# Patient Record
Sex: Female | Born: 1960 | Race: Asian | Hispanic: No | Marital: Married | Smoking: Never smoker
Health system: Southern US, Community
[De-identification: ages and names within clinical notes are randomized; demographics above are authoritative.]

---

## 2016-06-13 ENCOUNTER — Other Ambulatory Visit: Payer: Self-pay | Admitting: Internal Medicine

## 2016-06-13 DIAGNOSIS — Z1231 Encounter for screening mammogram for malignant neoplasm of breast: Secondary | ICD-10-CM

## 2016-07-01 ENCOUNTER — Ambulatory Visit
Admission: RE | Admit: 2016-07-01 | Discharge: 2016-07-01 | Disposition: A | Discharge status: Home / Self Care | Payer: BLUE CROSS/BLUE SHIELD | Source: Ambulatory Visit | Attending: Internal Medicine | Admitting: Internal Medicine

## 2016-07-01 ENCOUNTER — Encounter: Payer: Self-pay | Admitting: Radiology

## 2016-07-01 DIAGNOSIS — Z1231 Encounter for screening mammogram for malignant neoplasm of breast: Secondary | ICD-10-CM

## 2017-05-15 ENCOUNTER — Ambulatory Visit: Payer: BLUE CROSS/BLUE SHIELD | Attending: Orthopedic Surgery | Admitting: Occupational Therapy

## 2017-05-15 ENCOUNTER — Other Ambulatory Visit: Payer: Self-pay

## 2017-05-15 DIAGNOSIS — M6281 Muscle weakness (generalized): Secondary | ICD-10-CM | POA: Insufficient documentation

## 2017-05-15 DIAGNOSIS — M25542 Pain in joints of left hand: Secondary | ICD-10-CM | POA: Insufficient documentation

## 2017-05-15 DIAGNOSIS — R208 Other disturbances of skin sensation: Secondary | ICD-10-CM | POA: Diagnosis present

## 2017-05-15 DIAGNOSIS — M25541 Pain in joints of right hand: Secondary | ICD-10-CM | POA: Diagnosis present

## 2017-05-15 NOTE — Therapy (Signed)
[facility] PH[facility] AND SP[facility] MEDICINE [address] [place], [place], [address] Phone: [phone]   Fax:  [phone]  Occupational Therapy Evaluation  Patient Details  Name: Rhonda James MRN: [id number] Date of Birth: 01/03/1961 Referring Provider: Rosita KeaMenz   Encounter Date: 05/15/2017  OT End of Session - 05/15/17 1257    Visit Number  1    Number of Visits  8    Date for OT Re-Evaluation  06/12/17    OT Start Time  0918    OT Stop Time  1011    OT Time Calculation (min)  53 min    Activity Tolerance  Patient tolerated treatment well    Behavior During Therapy  [facility] for tasks assessed/performed      Past Medical History: Past Medical History:  Diagnosis Date  . GERD (gastroesophageal reflux disease)  . Osteoporosis   Past Surgical History: Past Surgical History:  Procedure Laterality Date  . No Past Surgery    Medications: Current Outpatient Medications Ordered in Epic  Medication Sig Dispense Refill  . acetaminophen (TYLENOL) 500 MG tablet Take 500 mg by mouth every 8 (eight) hours as needed for Pain.  Marland Kitchen. alendronate (FOSAMAX) 70 MG tablet Take 1 tablet (70 mg total) by mouth every 7 (seven) days Take with a full glass of water. Do not lie down for the next 30 min. 12 tablet 3  . calcium citrate-vitamin D3 (CITRACAL+D) 315-200 mg-unit tablet Take 2 tablets by mouth 2 (two) times daily with meals 120 tablet 11  . pantoprazole (PROTONIX) 40 MG DR tablet Take 1 tablet (40 mg total) by mouth once daily 90 tablet 3      Allergies: No Known Allergies       .  There were no vitals filed for this visit.  Subjective Assessment - 05/15/17 1246    Subjective   I seen Dr 2 wks ago and he gave me shot - my pain in my R thumb better but L thumb still the same, numbness in L middle finger, R index and middle fingers - had it about 6 months at least and the thumb pain started about 6 wks ago - i work at [employer]  6 days a week - 7-10 hrs day      Patient Stated Goals  I want to get the pain and numbness better so I can do my work  and use them better     Currently in Pain?  Yes    Pain Score  6     Pain Location  -- Thumb    Pain Orientation  Left R thumb 2/10    Pain Descriptors / Indicators  Aching;Numbness;Sharp;Shooting    Pain Type  Acute pain    Pain Onset  More than a month ago    Pain Frequency  Constant        [facility] OT Assessment - 05/15/17 0001      Assessment   Medical Diagnosis  Bilateral CTS and DeQuervain    Referring Provider  Rosita KeaMenz    Onset Date/Surgical Date  10/28/16    Hand Dominance  Right    Next MD Visit  -- in 4 wks after OT       Home  Environment   Lives With  Spouse      Prior Function   Vocation  Full time employment    Leisure  Nail salon, house work       Palpation   Palpation comment  Positive Finkelstein L worse than R , Tender over distal radius head       AROM   Left Wrist Flexion  -- increase pain     Left Wrist Radial Deviation  -- increase pain       Strength   Right Hand Grip (lbs)  35    Right Hand Lateral Pinch  13 lbs    Right Hand 3 Point Pinch  12 lbs    Left Hand Grip (lbs)  26    Left Hand Lateral Pinch  11 lbs    Left Hand 3 Point Pinch  10 lbs      Left Hand AROM   L Thumb Radial ADduction/ABduction 0-55  -- increase pain     L Thumb Palmar ADduction/ABduction 0-45  -- increase pain             COntrast on bilateral hands and wrist   3 x day  Wrist prefab splint on R wrist - most all the time   and  Fabricated Custom thumb spica over radial side of wrist - and wrist neoprene wrap - wear most all the time   Change the way use her hand - massaging and doing nail  Avoid tight grip  And use larger joints - palms to massage  And forearm to carry and palms             OT Education - 05/15/17 1256    Education provided  Yes    Education Details  anatomy , findings and HEP     Person(s) Educated  Patient interpreter vietnamese    interpreter  vietnamese    Methods  Explanation;Demonstration;Tactile cues;Handout    Comprehension  Verbalized understanding;Returned demonstration       OT Short Term Goals - 05/15/17 1305      OT SHORT TERM GOAL #1   Title  Pt pain on PRWHE improve with more than 20 points     Baseline  pain at eval PRWHE 33/50     Time  3    Period  Weeks    Status  New    Target Date  06/05/17        OT Long Term Goals - 05/15/17 1307      OT LONG TERM GOAL #1   Title  Pt to be independent in HEP for modifications of work act , splint wearing and ROM / modalities     Baseline  no knowledge    Time  4    Period  Weeks    Status  New    Target Date  06/12/17      OT LONG TERM GOAL #2   Title  Function on PRWHE for hands improve with more than 15 points     Baseline  at eval 26/50 on PRWHE for function     Time  4    Period  Weeks    Status  New    Target Date  06/12/17            Plan - 05/15/17 1258    Clinical Impression Statement  Pt present at OT evaluation with diagnosis of bilateral CTS and DeQuervains - unclear to get detail thur interpreter about when symptoms started - she says numbness is day and night time and at least 6 months and then tendonitis at the thumbs about 6 wks - she had shorts at MD 2 wks ago - R hand feels better but  L still the  same - pt 2/10 on R and 6/10 on L  - AROM is WNL but pain - and positve test for DeQuervain in L worse than R - pt work  in [employer] and report she cannot wear splint  there - and  can sleep with it - reinforce for pt to wear splints as much as she can to decrease symptoms - and immoblilize L thumb - pt also ed on 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 - pt can benefit from OT services     Occupational performance deficits (Please refer to evaluation for details):  ADL's;IADL's;Work;Play;Leisure;Social Participation    Rehab Potential  Fair    Current Impairments/barriers affecting progress:  Pt working at  [employer] and unable to modify her activities and wear splint     OT Frequency  2x / week    OT Duration  4 weeks    OT Treatment/Interventions  Self-care/ADL training;Therapeutic exercise;Contrast Bath;Ultrasound;Manual Therapy;Patient/family education;Splinting    Plan  Assess if pt could wear her splints and modify work     Clinical Decision Making  Several treatment options, min-mod task modification necessary    OT Home Exercise Plan  see pt instruction    Consulted and Agree with Plan of Care  Patient       Patient will benefit from skilled therapeutic intervention in order to improve the following deficits and impairments:  Pain, Impaired flexibility, Impaired sensation, Impaired UE functional use, Decreased strength  Visit Diagnosis: Pain in joint of left hand - Plan: Ot plan of care cert/re-cert  Pain in joint of right hand - Plan: Ot plan of care cert/re-cert  Other disturbances of skin sensation - Plan: Ot plan of care cert/re-cert  Muscle weakness (generalized) - Plan: Ot plan of care cert/re-cert    Problem List There are no active problems to display for this patient.   Oletta Cohn OTR/L,CLT 05/15/2017, 1:12 PM  [facility] PHYSICAL AND SPORTS MEDICINE [address], [place], [address] Phone: [phone]   Fax:  [phone]  Name: Rhonda James MRN: [id number] Date of Birth: 1961/01/08

## 2017-05-15 NOTE — Patient Instructions (Signed)
COntrast on bilateral hands and wrist   3 x day  Wrist prefab splint on R wrist - most all the time   and Custom thumb spica over radial side of wrist - and wrist neoprene wrap - wear most all the time   Change the way use her hand - massaging and doing nail  Avoid tight grip  And use larger joints - palms to massage  And forearm to carry and palms

## 2017-05-20 ENCOUNTER — Ambulatory Visit: Payer: BLUE CROSS/BLUE SHIELD | Admitting: Occupational Therapy

## 2017-05-20 DIAGNOSIS — M25541 Pain in joints of right hand: Secondary | ICD-10-CM

## 2017-05-20 DIAGNOSIS — M25542 Pain in joints of left hand: Secondary | ICD-10-CM

## 2017-05-20 DIAGNOSIS — R208 Other disturbances of skin sensation: Secondary | ICD-10-CM

## 2017-05-20 DIAGNOSIS — M6281 Muscle weakness (generalized): Secondary | ICD-10-CM

## 2017-05-20 NOTE — Therapy (Signed)
[facility] SPORTS MEDICINE [address] [place], [address] Phone: [phone]   Fax:  [phone]  Occupational Therapy Treatment  Patient Details  Name: Rhonda James MRN: [id number] Date of Birth: 1960/12/21 Referring Provider: Rosita Kea   Encounter Date: 05/20/2017  OT End of Session - 05/20/17 1056    Visit Number  2    Number of Visits  8    Date for OT Re-Evaluation  06/12/17    OT Start Time  0823    OT Stop Time  0919    OT Time Calculation (min)  56 min    Activity Tolerance  Patient tolerated treatment well    Behavior During Therapy  [facility] for tasks assessed/performed       No past medical history on file.  No past surgical history on file.  There were no vitals filed for this visit.  Subjective Assessment - 05/20/17 0909    Subjective   Little better- pain about 2/10 on R and L 5/10- splints I did wear at night time - but not when I got home - numbness still on and off during day and night time on R - middle 3 fingers     Patient Stated Goals  I want to get the pain and numbness better so I can do my work  and use them better     Pain Score  5     Pain Location  -- L 1 st dorsal compartment     Pain Orientation  Left    Pain Type  Acute pain    Pain Onset  More than a month ago    Pain Frequency  Constant        Tender over distal radius head and positive Finkelstein on L  5/10 and R 2/10 Numbness still in middle 3 digits comes and goes on the R  Splints she only wore at night time  Did try and massage at work little different and not using her thumbs as much              OT Treatments/Exercises (OP) - 05/20/17 0001      Iontophoresis   Type of Iontophoresis  Dexamethasone    Location  L 1 st dorsal compartment     Dose  small patch - 2.0 current     Time  19      RUE Contrast Bath   Time  11 minutes    Comments  decrease pain and numbness and prrio to soft tissue mobs       LUE  Contrast Bath   Time  11 minutes    Comments  decrease pain prior to soft tissue mobs      after contrast did some soft tissue to bilateral webspaces  And then Graston tool nr 2 for brushing , sweeping over dorsal and volar forearms and wrist   pt tight in volar wrist and forearm   pt do report years ago she had some issues with numbness in middle finger tip on R and trigger thumb on there L when she use to live in [place]   SKin check done on L wrist and pt ed on ionto   and what to expect - and then skin check done after wards - no issues on skin         OT Education - 05/20/17 1056    Education provided  Yes    Education Details  ionto use  and Homeprogram/ splint use     Person(s) Educated  Patient interpreter   interpreter   Methods  Explanation;Demonstration;Tactile cues    Comprehension  Verbalized understanding;Returned demonstration       OT Short Term Goals - 05/15/17 1305      OT SHORT TERM GOAL #1   Title  Pt pain on PRWHE improve with more than 20 points     Baseline  pain at eval PRWHE 33/50     Time  3    Period  Weeks    Status  New    Target Date  06/05/17        OT Long Term Goals - 05/15/17 1307      OT LONG TERM GOAL #1   Title  Pt to be independent in HEP for modifications of work act , splint wearing and ROM / modalities     Baseline  no knowledge    Time  4    Period  Weeks    Status  New    Target Date  06/12/17      OT LONG TERM GOAL #2   Title  Function on PRWHE for hands improve with more than 15 points     Baseline  at eval 26/50 on PRWHE for function     Time  4    Period  Weeks    Status  New    Target Date  06/12/17            Plan - 05/20/17 1205    Clinical Impression Statement  Pt report she tried to change little 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 but cannot wear splints at work - feel like pain is little better -and numbness in R hand comes and goes - did wear splints at night time - husband can only bring pt on  off days - pt will not have her next ionto until week from now - but reinforce with pt to modify how she massage and do the nails and wear splints as much as she can     Occupational performance deficits (Please refer to evaluation for details):  ADL's;IADL's;Work;Play;Leisure;Social Participation    Rehab Potential  Fair    Current Impairments/barriers affecting progress:  Pt working at [employer] and unable to modify her activities and wear splint     OT Frequency  2x / week    OT Duration  4 weeks    OT Treatment/Interventions  Self-care/ADL training;Therapeutic exercise;Contrast Bath;Ultrasound;Manual Therapy;Patient/family education;Splinting    Plan  Ionto - assess if pain and numbness improve     Clinical Decision Making  Several treatment options, min-mod task modification necessary    OT Home Exercise Plan  see pt instruction    Consulted and Agree with Plan of Care  Patient       Patient will benefit from skilled therapeutic intervention in order to improve the following deficits and impairments:  Pain, Impaired flexibility, Impaired sensation, Impaired UE functional use, Decreased strength  Visit Diagnosis: Pain in joint of left hand  Pain in joint of right hand  Muscle weakness (generalized)  Other disturbances of skin sensation    Problem List There are no active problems to display for this patient.   Oletta CohnuPreez, Shalom Mcguiness OTR/L,CLT 05/20/2017, 12:09 PM  [facility] [address] , [place], [address] Phone: [phone]   Fax:  [phone]  Name: Rhonda James MRN: [id number] Date of Birth: 06/20/1960

## 2017-05-20 NOTE — Patient Instructions (Signed)
Same HEP  

## 2017-05-28 ENCOUNTER — Ambulatory Visit: Payer: BLUE CROSS/BLUE SHIELD | Attending: Orthopedic Surgery | Admitting: Occupational Therapy

## 2017-05-28 DIAGNOSIS — M6281 Muscle weakness (generalized): Secondary | ICD-10-CM | POA: Diagnosis present

## 2017-05-28 DIAGNOSIS — M25542 Pain in joints of left hand: Secondary | ICD-10-CM | POA: Diagnosis not present

## 2017-05-28 DIAGNOSIS — R208 Other disturbances of skin sensation: Secondary | ICD-10-CM | POA: Insufficient documentation

## 2017-05-28 DIAGNOSIS — M25541 Pain in joints of right hand: Secondary | ICD-10-CM | POA: Insufficient documentation

## 2017-05-28 NOTE — Therapy (Signed)
[facility]ORTS MEDICINE [address] [place], [address] Phone: [phone]   Fax:  [phone]  Occupational Therapy Treatment  Patient Details  Name: Rhonda James MRN: [id number] Date of Birth: 1960/07/09 Referring Provider: Rosita Kea   Encounter Date: 05/28/2017  OT End of Session - 05/28/17 0917    Visit Number  3    Number of Visits  8    Date for OT Re-Evaluation  06/12/17    OT Start Time  0900    OT Stop Time  0957    OT Time Calculation (min)  57 min    Activity Tolerance  Patient tolerated treatment well    Behavior During Therapy  [facility] for tasks assessed/performed       No past medical history on file.  No past surgical history on file.  There were no vitals filed for this visit.  Subjective Assessment - 05/28/17 0912    Subjective   Little better again - still wearing splints only at night time - but not at work able to - numbness still in R middle 3 fingers come and goes -and some at night time - pain 5/10 in L wrist /base of thumb and 2/10 but only with pressure base of thumb     Patient Stated Goals  I want to get the pain and numbness better so I can do my work  and use them better     Currently in Pain?  Yes    Pain Score  5     Pain Location  -- 1st dorsal compartment     Pain Orientation  Left    Pain Descriptors / Indicators  Aching;Numbness;Tender    Pain Type  Acute pain          Tender over distal radius head and positive Finkelstein on L  5/10 and R 2/10 Numbness still in middle 3 digits comes and goes on the R  Splints she only wore at night time  Did try and massage at work little different and not using her thumbs as much Review again with pt to grasp tools not tight            OT Treatments/Exercises (OP) - 05/28/17 0001      Iontophoresis   Type of Iontophoresis  Dexamethasone    Location  L 1 st dorsal compartment       RUE Contrast Bath   Time  11 minutes    Comments   decrease pain       LUE Contrast Bath   Time  11 minutes    Comments  decrease pain        after contrast did some soft tissue to bilateral webspaces  And then Graston tool nr 2 for brushing , sweeping over dorsal and volar forearms and wrist   pt tight in volar wrist and forearm    SKin check done on L wrist prior ionto - and pt ed on ionto - did med patch this date - at 1.3 and 1.7 current   and what to expect - and then skin check done after wards - had one pin size little blister - pt to use neosporin if needed        OT Education - 05/28/17 0917    Education provided  Yes    Education Details  splint wearing and ionto use -     Person(s) Educated  Patient interpreter   interpreter   Methods  Explanation;Demonstration;Tactile cues    Comprehension  Verbalized understanding;Returned demonstration       OT Short Term Goals - 05/15/17 1305      OT SHORT TERM GOAL #1   Title  Pt pain on PRWHE improve with more than 20 points     Baseline  pain at eval PRWHE 33/50     Time  3    Period  Weeks    Status  New    Target Date  06/05/17        OT Long Term Goals - 05/15/17 1307      OT LONG TERM GOAL #1   Title  Pt to be independent in HEP for modifications of work act , splint wearing and ROM / modalities     Baseline  no knowledge    Time  4    Period  Weeks    Status  New    Target Date  06/12/17      OT LONG TERM GOAL #2   Title  Function on PRWHE for hands improve with more than 15 points     Baseline  at eval 26/50 on PRWHE for function     Time  4    Period  Weeks    Status  New    Target Date  06/12/17            Plan - 05/28/17 0936    Clinical Impression Statement  Pt report pain little better and that hse is 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 - doing okay with R but still numbness - still can only wear splints at night time only      Occupational performance deficits (Please refer to evaluation for details):   ADL's;IADL's;Work;Play;Leisure;Social Participation    Rehab Potential  Fair    Current Impairments/barriers affecting progress:  Pt working at [employer] and unable to modify her activities and wear splint     OT Frequency  2x / week    OT Duration  4 weeks    OT Treatment/Interventions  Self-care/ADL training;Therapeutic exercise;Contrast Bath;Ultrasound;Manual Therapy;Patient/family education;Splinting    Plan  Ionto 3rd session  - assess if pain and numbness improve     Clinical Decision Making  Several treatment options, min-mod task modification necessary    OT Home Exercise Plan  see pt instruction    Consulted and Agree with Plan of Care  Patient       Patient will benefit from skilled therapeutic intervention in order to improve the following deficits and impairments:  Pain, Impaired flexibility, Impaired sensation, Impaired UE functional use, Decreased strength  Visit Diagnosis: Pain in joint of left hand  Pain in joint of right hand  Muscle weakness (generalized)  Other disturbances of skin sensation    Problem List There are no active problems to display for this patient.   Oletta Cohn OTR/L,CLT 05/28/2017, 9:55 AM  [facility] PHYSICAL AND SPORTS MEDICINE [address], [place], [address] Phone: [phone]   Fax:  [phone]  Name: Rhonda James MRN: [id number] Date of Birth: 06/22/1960

## 2017-05-29 ENCOUNTER — Ambulatory Visit: Payer: BLUE CROSS/BLUE SHIELD | Admitting: Occupational Therapy

## 2017-05-29 DIAGNOSIS — M6281 Muscle weakness (generalized): Secondary | ICD-10-CM

## 2017-05-29 DIAGNOSIS — R208 Other disturbances of skin sensation: Secondary | ICD-10-CM

## 2017-05-29 DIAGNOSIS — M25541 Pain in joints of right hand: Secondary | ICD-10-CM

## 2017-05-29 DIAGNOSIS — M25542 Pain in joints of left hand: Secondary | ICD-10-CM

## 2017-05-29 NOTE — Patient Instructions (Signed)
Same

## 2017-05-29 NOTE — Therapy (Signed)
[facility] PHYSICAL AND SPORTS MEDICINE [address] [place], [address] Phone: [phone]   Fax:  [phone]  Occupational Therapy Treatment  Patient Details  Name: Rhonda James MRN: [id number] Date of Birth: Mar 12, 1960 Referring Provider: Rosita Kea   Encounter Date: 05/29/2017  OT End of Session - 05/29/17 2025    Visit Number  4    Number of Visits  8    Date for OT Re-Evaluation  06/12/17    OT Start Time  1440    OT Stop Time  1520    OT Time Calculation (min)  40 min    Activity Tolerance  Patient tolerated treatment well    Behavior During Therapy  [facility] for tasks assessed/performed       No past medical history on file.  No past surgical history on file.  There were no vitals filed for this visit.  Subjective Assessment - 05/29/17 1510    Subjective   My hands little better- my R wrist feels good ,  my L hand     Patient Stated Goals  I want to get the pain and numbness better so I can do my work  and use them better     Currently in Pain?  Yes    Pain Score  4     Pain Location  Wrist    Pain Orientation  Left    Pain Descriptors / Indicators  Aching       No pain with AROM for R thumb and wrist - tenderness over distal radius - 2/10  L wrist tenderness 4/10   pain with RD and thumb RA - resistance and AROM   but improving  Tender over proximal extensor mass at forearm L             OT Treatments/Exercises (OP) - 05/29/17 0001      Iontophoresis   Type of Iontophoresis  Dexamethasone    Location  L 1 st dorsal compartment     Dose  small patch - 2.0 current     Time  19      RUE Contrast Bath   Time  11 minutes    Comments  decrease pain       LUE Contrast Bath   Time  11 minutes    Comments  decrease pain prior ot soft tissue mobs        after contrast did some soft tissue to bilateral webspaces  And then Graston tool nr 2 for brushing , sweeping over dorsal and volar forearm on L  and wrist   trigger point on proximal forearm - extensor thumb - improve with graston    SKin check done on L wrist prior ionto - had tiny scab from blister yesterday - cut ionto patch small around it - tolerate with no issues   Explain what to expect - and then skin check done after wards -  ed         OT Education - 05/29/17 2024    Education provided  Yes    Education Details  ionto and improvement     Person(s) Educated  Patient interpreter   interpreter   Methods  Explanation    Comprehension  Verbalized understanding       OT Short Term Goals - 05/15/17 1305      OT SHORT TERM GOAL #1   Title  Pt pain on PRWHE improve with more than 20 points  Baseline  pain at eval PRWHE 33/50     Time  3    Period  Weeks    Status  New    Target Date  06/05/17        OT Long Term Goals - 05/15/17 1307      OT LONG TERM GOAL #1   Title  Pt to be independent in HEP for modifications of work act , splint wearing and ROM / modalities     Baseline  no knowledge    Time  4    Period  Weeks    Status  New    Target Date  06/12/17      OT LONG TERM GOAL #2   Title  Function on PRWHE for hands improve with more than 15 points     Baseline  at eval 26/50 on PRWHE for function     Time  4    Period  Weeks    Status  New    Target Date  06/12/17            Plan - 05/29/17 2025    Clinical Impression Statement  Pt report less than 2 /10 pain in R wrist - no tenderness - pain with L thumb RA and RD - tenderness but decrease to 4/10 - responding good with treatment - cont to wear splints as much as she can - she cannot work with it and can only come when her husband is off     Occupational performance deficits (Please refer to evaluation for details):  ADL's;IADL's;Work;Play;Leisure;Social Participation    Rehab Potential  Fair    Current Impairments/barriers affecting progress:  Pt working at [employer] and unable to modify her activities and wear splint     OT Frequency  2x /  week    OT Duration  4 weeks    OT Treatment/Interventions  Self-care/ADL training;Therapeutic exercise;Contrast Bath;Ultrasound;Manual Therapy;Patient/family education;Splinting    Plan  Ionto 3rd session  - assess if pain and numbness improve     Clinical Decision Making  Several treatment options, min-mod task modification necessary    OT Home Exercise Plan  see pt instruction    Consulted and Agree with Plan of Care  Patient       Patient will benefit from skilled therapeutic intervention in order to improve the following deficits and impairments:  Pain, Impaired flexibility, Impaired sensation, Impaired UE functional use, Decreased strength  Visit Diagnosis: Pain in joint of left hand  Pain in joint of right hand  Muscle weakness (generalized)  Other disturbances of skin sensation    Problem List There are no active problems to display for this patient.   Oletta Cohn OTR/l,CLT 05/29/2017, 8:28 PM  [facility] PHYSICAL AND SPORTS MEDICINE [address] [place], [address] Phone: [phone]   Fax:  [phone]  Name: Marcelle Hepner MRN: [id number] Date of Birth: 07/30/1960

## 2017-06-02 ENCOUNTER — Ambulatory Visit: Payer: BLUE CROSS/BLUE SHIELD | Admitting: Occupational Therapy

## 2017-06-02 DIAGNOSIS — M25542 Pain in joints of left hand: Secondary | ICD-10-CM

## 2017-06-02 DIAGNOSIS — M25541 Pain in joints of right hand: Secondary | ICD-10-CM

## 2017-06-02 DIAGNOSIS — R208 Other disturbances of skin sensation: Secondary | ICD-10-CM

## 2017-06-02 DIAGNOSIS — M6281 Muscle weakness (generalized): Secondary | ICD-10-CM

## 2017-06-02 NOTE — Patient Instructions (Signed)
Same - husband can help with Carpel spreads to R
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
## 2017-06-11 ENCOUNTER — Ambulatory Visit: Payer: BLUE CROSS/BLUE SHIELD | Admitting: Occupational Therapy

## 2017-06-11 DIAGNOSIS — M25542 Pain in joints of left hand: Secondary | ICD-10-CM | POA: Diagnosis not present

## 2017-06-11 DIAGNOSIS — R208 Other disturbances of skin sensation: Secondary | ICD-10-CM

## 2017-06-11 DIAGNOSIS — M6281 Muscle weakness (generalized): Secondary | ICD-10-CM

## 2017-06-11 DIAGNOSIS — M25541 Pain in joints of right hand: Secondary | ICD-10-CM

## 2017-06-11 NOTE — Therapy (Signed)
[facility] PHYS[facility]TS [facility]ICINE [address] [place], [address] Phone: [phone]   Fax:  [phone]  Occupational Therapy Treatment  Patient Details  Name: Omya Winfield MRN: [id number] Date of Birth: [birth year] Referring Provider: Rosita Kea   Encounter Date: 06/11/2017  OT End of Session - 06/11/17 0925    Visit Number  6    Number of Visits  8    Date for OT Re-Evaluation  06/12/17    OT Start Time  0907    OT Stop Time  1000    OT Time Calculation (min)  53 min    Activity Tolerance  Patient tolerated treatment well    Behavior During Therapy  [facility] for tasks assessed/performed       No past medical history on file.  No past surgical history on file.  There were no vitals filed for this visit.  Subjective Assessment - 06/11/17 0922    Subjective   Pain is about same than last week - was busy at work - because it was mother day and summer start - everybody wants their nails done     Patient Stated Goals  I want to get the pain and numbness better so I can do my work  and use them better     Currently in Pain?  Yes    Pain Score  4     Pain Location  Wrist    Pain Orientation  Left    Pain Descriptors / Indicators  Aching;Tender    Pain Type  Acute pain    Pain Onset  More than a month ago         No pain with AROM for R thumb and wrist - no  tenderness over distal radius  L wrist tenderness 4/10  And positive Lourena Simmonds  pt with very little RD  - add PROM for RD, but pain free  thumb RA - resistance and AROM pain but PA no pain  Cont to have some tightness and fibrosis at proximal extensor mass at forearm L- Graston tool nr 2 done for sweeping and brushing over proximal forerarm - dorsal and over bilateral volar forearm and wrist  And palm on R hand prior ionto          skin check done - no issues -pt going to work after session - cannot keep patch on   OT Treatments/Exercises (OP) - 06/11/17 0001      Iontophoresis   Type of Iontophoresis  Dexamethasone    Location  L 1 st dorsal compartment     Dose  small patch - 2.0 current     Time  19      LUE Contrast Bath   Time  11 minutes    Comments  decrease pain              OT Education - 06/11/17 0924    Education provided  Yes    Education Details  splint wearing and joint protection     Person(s) Educated  Patient    Methods  Explanation;Demonstration    Comprehension  Verbalized understanding;Returned demonstration       OT Short Term Goals - 05/15/17 1305      OT SHORT TERM GOAL #1   Title  Pt pain on PRWHE improve with more than 20 points     Baseline  pain at eval PRWHE 33/50     Time  3    Period  Weeks    Status  New    Target Date  06/05/17        OT Long Term Goals - 05/15/17 1307      OT LONG TERM GOAL #1   Title  Pt to be independent in HEP for modifications of work act , splint wearing and ROM / modalities     Baseline  no knowledge    Time  4    Period  Weeks    Status  New    Target Date  06/12/17      OT LONG TERM GOAL #2   Title  Function on PRWHE for hands improve with more than 15 points     Baseline  at eval 26/50 on PRWHE for function     Time  4    Period  Weeks    Status  New    Target Date  06/12/17            Plan - 06/11/17 5409    Clinical Impression Statement  Pt's pain did not decrease since last time -was seen week ago - because of husbands schedule - pt report still numbness in R hand- 3 middle fingers - but not as night time - pt cont to decrease pain and numbness    Occupational performance deficits (Please refer to evaluation for details):  ADL's;IADL's;Work;Play;Leisure;Social Participation    Rehab Potential  Fair    Current Impairments/barriers affecting progress:  Pt working at [employer] and unable to modify her activities and wear splint     OT Frequency  2x / week    OT Duration  2 weeks    OT Treatment/Interventions  Self-care/ADL training;Therapeutic  exercise;Contrast Bath;Ultrasound;Manual Therapy;Patient/family education;Splinting    Plan  Ionto 4th  session  - assess if pain and numbness improve     Clinical Decision Making  Several treatment options, min-mod task modification necessary    OT Home Exercise Plan  see pt instruction    Consulted and Agree with Plan of Care  Patient       Patient will benefit from skilled therapeutic intervention in order to improve the following deficits and impairments:  Pain, Impaired flexibility, Impaired sensation, Impaired UE functional use, Decreased strength  Visit Diagnosis: Pain in joint of left hand  Pain in joint of right hand  Muscle weakness (generalized)  Other disturbances of skin sensation    Problem List There are no active problems to display for this patient.   Oletta Cohn OTR/L,CLT 06/11/2017, 9:53 AM   [facility] PHYSICAL AND SPORTS MEDICINE [address], [place], [address] Phone: [phone]   Fax:  [phone]  Name: Roland Prine MRN: [id number] Date of Birth: 09-02-1960

## 2017-06-11 NOTE — Patient Instructions (Signed)
Same HEP  

## 2017-06-12 ENCOUNTER — Ambulatory Visit: Payer: BLUE CROSS/BLUE SHIELD | Admitting: Occupational Therapy

## 2017-06-12 DIAGNOSIS — M6281 Muscle weakness (generalized): Secondary | ICD-10-CM

## 2017-06-12 DIAGNOSIS — M25542 Pain in joints of left hand: Secondary | ICD-10-CM

## 2017-06-12 DIAGNOSIS — M25541 Pain in joints of right hand: Secondary | ICD-10-CM

## 2017-06-12 DIAGNOSIS — R208 Other disturbances of skin sensation: Secondary | ICD-10-CM

## 2017-06-12 NOTE — Therapy (Signed)
[facility] PHYSICAL [facility] SPORTS MEDICINE [address], [place], [address] Phone: [phone]   Fax:  [phone]  Occupational Therapy Treatment  Patient Details  Name: Rhonda James MRN: [id number] Date of Birth: 1960/12/25 Referring Provider: Rosita Kea   Encounter Date: 06/12/2017  OT End of Session - 06/12/17 1441    Visit Number  7    Number of Visits  11    Date for OT Re-Evaluation  07/10/17    OT Start Time  1345    OT Stop Time  1435    OT Time Calculation (min)  50 min    Activity Tolerance  Patient tolerated treatment well    Behavior During Therapy  [facility] for tasks assessed/performed       No past medical history on file.  No past surgical history on file.  There were no vitals filed for this visit.  Subjective Assessment - 06/12/17 1432    Subjective   Feeling better, because I have seen you yesterday - did not had numbness in my R hand since last time -     Patient Stated Goals  I want to get the pain and numbness better so I can do my work  and use them better     Currently in Pain?  Yes    Pain Score  4     Pain Location  Wrist    Pain Orientation  Left    Pain Descriptors / Indicators  Tender    Pain Type  Acute pain    Pain Onset  More than a month ago         No pain with AROM for R thumb and wrist -notenderness over distal radius  Or numbness the last 24 rhs  L wrist tenderness4/10  And positive Lourena Simmonds  pt  Show increase RD this date after adding AAROM - and review with pt again for RD, but pain free  thumb RA - resistance and AROM pain but PA no pain  Cont to have some tightness and fibrosis at proximal extensor mass at forearm L- Graston tool nr 2 done for sweeping and brushing over proximal forerarm - dorsal and over bilateral volar forearm and wrist  And palm on R hand prior ionto   skin check done - no issues -pt going to work after session - cannot keep patch on             OT  Treatments/Exercises (OP) - 06/12/17 0001      Iontophoresis   Type of Iontophoresis  Dexamethasone    Location  L 1 st dorsal compartment     Dose  small patch - 2.0 current     Time  19      RUE Contrast Bath   Time  11 minutes    Comments  wrist and forearm prior ro soft tissue       LUE Contrast Bath   Time  11 minutes    Comments  forearm and wrist - prior to soft tissue mobs              OT Education - 06/12/17 1435    Education provided  Yes    Education Details  progress and splint wearing     Person(s) Educated  Patient    Methods  Explanation;Demonstration    Comprehension  Returned demonstration       OT Short Term Goals - 06/12/17 1443      OT SHORT  TERM GOAL #1   Title  Pt pain on PRWHE improve with more than 20 points     Baseline  pain at eval PRWHE 33/50 - no pain in R wrist - L at the worse 4/10     Time  3    Period  Weeks    Status  On-going    Target Date  07/03/17        OT Long Term Goals - 06/12/17 1444      OT LONG TERM GOAL #1   Title  Pt to be independent in HEP for modifications of work act , splint wearing and ROM / modalities     Baseline  splint wearing - but cannot wear at work - try to modify work act , and modalities at home - can only come on husbands off days     Time  4    Period  Weeks    Status  On-going    Target Date  07/10/17      OT LONG TERM GOAL #2   Title  Function on PRWHE for hands improve with more than 15 points     Baseline  at eval 26/50 on PRWHE for function - improving     Time  4    Period  Weeks    Status  On-going    Target Date  07/10/17            Plan - 06/12/17 1442    Clinical Impression Statement  Pt report she had no pain or numbness in R hand the last 24 hrs - and then L wrist still tender over distal radius head, pain with thumb RA and no pain with AROM of wrist - RD improving in ROM - pt limited in that she cannot work with splint - 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 - but she is trying to [employer] act      Occupational performance deficits (Please refer to evaluation for details):  ADL's;IADL's;Work;Play;Leisure;Social Participation    Rehab Potential  Fair    Current Impairments/barriers affecting progress:  Pt working at [employer] and unable to modify her activities and wear splint - she had some symptoms in past in [place]    OT Frequency  2x / week    OT Duration  4 weeks    OT Treatment/Interventions  Self-care/ADL training;Therapeutic exercise;Contrast Bath;Ultrasound;Manual Therapy;Patient/family education;Splinting    Plan  Ionto 7th  session  - assess if pain and numbness improve     Clinical Decision Making  Several treatment options, min-mod task modification necessary    OT Home Exercise Plan  see pt instruction    Consulted and Agree with Plan of Care  Patient       Patient will benefit from skilled therapeutic intervention in order to improve the following deficits and impairments:  Pain, Impaired flexibility, Impaired sensation, Impaired UE functional use, Decreased strength  Visit Diagnosis: Pain in joint of left hand - Plan: Ot plan of care cert/re-cert  Pain in joint of right hand - Plan: Ot plan of care cert/re-cert  Muscle weakness (generalized) - Plan: Ot plan of care cert/re-cert  Other disturbances of skin sensation - Plan: Ot plan of care cert/re-cert    Problem List There are no active problems to display for this patient.   Oletta Cohn OTR/l,CLT 06/12/2017, 2:50 PM  [facility] PHYSICAL AND SPORTS MEDICINE [address], [place], [address] Phone: [phone]  Fax:  [phone]  Name: Rhonda James MRN: [id number] Date of Birth: Nov 14, 1960

## 2017-06-12 NOTE — Patient Instructions (Signed)
Same

## 2017-06-17 ENCOUNTER — Ambulatory Visit: Payer: BLUE CROSS/BLUE SHIELD | Admitting: Occupational Therapy

## 2017-06-17 DIAGNOSIS — M6281 Muscle weakness (generalized): Secondary | ICD-10-CM

## 2017-06-17 DIAGNOSIS — M25541 Pain in joints of right hand: Secondary | ICD-10-CM

## 2017-06-17 DIAGNOSIS — R208 Other disturbances of skin sensation: Secondary | ICD-10-CM

## 2017-06-17 DIAGNOSIS — M25542 Pain in joints of left hand: Secondary | ICD-10-CM

## 2017-06-17 NOTE — Therapy (Signed)
[facility] PHYSICAL [facility] SPORTS MEDICINE [address] [place], [address] Phone: [phone]   Fax:  [phone]  Occupational Therapy Treatment  Patient Details  Name: Rhonda James MRN: [id number] Date of Birth: September 25, 1960 Referring Provider: Rosita Kea   Encounter Date: 06/17/2017  OT End of Session - 06/17/17 0927    Visit Number  8    Number of Visits  11    Date for OT Re-Evaluation  07/10/17    OT Start Time  0915    OT Stop Time  1006    OT Time Calculation (min)  51 min    Activity Tolerance  Patient tolerated treatment well    Behavior During Therapy  [facility] for tasks assessed/performed       No past medical history on file.  No past surgical history on file.  There were no vitals filed for this visit.  Subjective Assessment - 06/17/17 0923    Subjective    Still have some numbness in the am in my middle 3 fingers on R - and then the pain is getting better- but if you push there - it is about 6/10     Patient Stated Goals  I want to get the pain and numbness better so I can do my work  and use them better     Currently in Pain?  Yes    Pain Score  6     Pain Location  Wrist    Pain Orientation  Left    Pain Descriptors / Indicators  Tender    Pain Type  Acute pain    Pain Onset  More than a month ago        No pain with AROM for R thumb and wrist -notenderness over distal radius  Numbness in the am per pt - middle 3 digits L wrist tenderness6/10And positive Finkelstein Report she was busy at work and had to do massages    thumb RA - resistance and AROMpain but PA no pain  Cont to have some tightness and fibrosis atproximal extensor mass at forearm R and L this date - but L worse than R - Graston tool nr 2 done for sweeping and brushing over proximal forerarm - dorsal and over bilateral volar forearm and wrist  And palm on R hand prior ionto   skin check done - no issues -pt going to work after session - cannot  keep patch on                OT Treatments/Exercises (OP) - 06/17/17 0001      Iontophoresis   Type of Iontophoresis  Dexamethasone    Location  L 1 st dorsal compartment     Dose  small patch - 2.0 current     Time  19      RUE Contrast Bath   Time  11 minutes    Comments  wrist and forearm prior to manual       LUE Contrast Bath   Time  11 minutes    Comments  wrist and forearm prior to manual              OT Education - 06/17/17 0927    Education provided  Yes    Education Details  had more tenderness this date - splint wearing and joint protection    Person(s) Educated  Patient    Methods  Explanation;Demonstration    Comprehension  Returned demonstration;Verbalized understanding  OT Short Term Goals - 06/12/17 1443      OT SHORT TERM GOAL #1   Title  Pt pain on PRWHE improve with more than 20 points     Baseline  pain at eval PRWHE 33/50 - no pain in R wrist - L at the worse 4/10     Time  3    Period  Weeks    Status  On-going    Target Date  07/03/17        OT Long Term Goals - 06/12/17 1444      OT LONG TERM GOAL #1   Title  Pt to be independent in HEP for modifications of work act , splint wearing and ROM / modalities     Baseline  splint wearing - but cannot wear at work - try to modify work act , and modalities at home - can only come on husbands off days     Time  4    Period  Weeks    Status  On-going    Target Date  07/10/17      OT LONG TERM GOAL #2   Title  Function on PRWHE for hands improve with more than 15 points     Baseline  at eval 26/50 on PRWHE for function - improving     Time  4    Period  Weeks    Status  On-going    Target Date  07/10/17            Plan - 06/17/17 1610    Clinical Impression Statement  Pt had more pain this date - 6/10 tenderness - last time was 4/10 - pt report she had to do at lot of pedicures at work and massages - numbness in middle R 3 digits mostly in the am - cont splint  outside of work - cannot wear at home     Occupational performance deficits (Please refer to evaluation for details):  ADL's;IADL's;Work;Play;Leisure;Social Participation    Rehab Potential  Fair    Current Impairments/barriers affecting progress:  Pt working at [employer] and unable to modify her activities and wear splint - she had some symptoms in past in [place]    OT Frequency  2x / week    OT Duration  4 weeks    OT Treatment/Interventions  Self-care/ADL training;Therapeutic exercise;Contrast Bath;Ultrasound;Manual Therapy;Patient/family education;Splinting    Plan  Ionto 8th  session  - assess if pain and numbness improve     Clinical Decision Making  Several treatment options, min-mod task modification necessary    OT Home Exercise Plan  see pt instruction    Consulted and Agree with Plan of Care  Patient       Patient will benefit from skilled therapeutic intervention in order to improve the following deficits and impairments:  Pain, Impaired flexibility, Impaired sensation, Impaired UE functional use, Decreased strength  Visit Diagnosis: Pain in joint of left hand  Pain in joint of right hand  Muscle weakness (generalized)  Other disturbances of skin sensation    Problem List There are no active problems to display for this patient.   Oletta Cohn OTR/L,CLT 06/17/2017, 9:55 AM  [facility] PHYSICAL AND SPORTS MEDICINE [address], [place], [address] Phone: [phone]   Fax:  [phone]  Name: Rhonda James MRN: [id number] Date of Birth: March 25, 1960

## 2017-06-17 NOTE — Patient Instructions (Signed)
Same

## 2017-06-20 ENCOUNTER — Ambulatory Visit: Payer: BLUE CROSS/BLUE SHIELD | Admitting: Occupational Therapy

## 2017-06-20 DIAGNOSIS — M6281 Muscle weakness (generalized): Secondary | ICD-10-CM

## 2017-06-20 DIAGNOSIS — M25542 Pain in joints of left hand: Secondary | ICD-10-CM

## 2017-06-20 DIAGNOSIS — R208 Other disturbances of skin sensation: Secondary | ICD-10-CM

## 2017-06-20 DIAGNOSIS — M25541 Pain in joints of right hand: Secondary | ICD-10-CM

## 2017-06-20 NOTE — Patient Instructions (Signed)
Reinforce and  Ed pt on using her hands and her tools with pedicures- not to use thumbs , but palms , and knuckles -and do not hold tools tight Hold toes between digits - not lat grip  And wear splints outside of work

## 2017-06-20 NOTE — Therapy (Signed)
[facility] PHYSICAL AND SPORTS MEDICINE [address], [place], [address] Phone: [phone]   Fax:  [phone]  Occupational Therapy Treatment  Patient Details  Name: Rhonda James MRN: [id number] Date of Birth: 05-27-1960 Referring Provider: Rosita Kea   Encounter Date: 06/20/2017  OT End of Session - 06/20/17 1045    Visit Number  9    Number of Visits  11    Date for OT Re-Evaluation  07/10/17    OT Start Time  0908    OT Stop Time  1011    OT Time Calculation (min)  63 min    Activity Tolerance  Patient tolerated treatment well    Behavior During Therapy  [facility] for tasks assessed/performed       No past medical history on file.  No past surgical history on file.  There were no vitals filed for this visit.  Subjective Assessment - 06/20/17 1041    Subjective   Pt report that R hand is much better- only some numbness in am in middle 3 fingers - but that is it - but L hand wrist still 6/10 pain - and that is her busy season at work    Patient Stated Goals  I want to get the pain and numbness better so I can do my work  and use them better     Currently in Pain?  Yes    Pain Score  6     Pain Location  Wrist    Pain Orientation  Left    Pain Descriptors / Indicators  Aching;Tender    Pain Type  Acute pain    Pain Onset  More than a month ago         Pt cont to have pain and tenderness over L 1st dorsal compartment - 6/10 this date again  Discuss with pt in length modifications to be done at work - review with her how she do Pedicures -not to lift legs but ask them to take it out  And then massage calves with palms - pt was doing feet with thumb flexion and extention   pt to use knuckles and palm - and hold file loose- and not toes with lat grip but between fingers and alternate digits  And wear splint outside of work           OT Treatments/Exercises (OP) - 06/20/17 0001      Iontophoresis   Type of Iontophoresis   Dexamethasone    Location  L 1 st dorsal compartment     Dose  small patch - 1.8 current     Time  24      RUE Contrast Bath   Time  11 minutes    Comments  piror to soft tissue to CT and forearm      LUE Contrast Bath   Time  11 minutes    Comments  prior to soft tissue , forearm  and CT        Cont to have some tightness and fibrosis atproximal extensor mass at forearm R and L this date - but L worse than R - Graston tool nr 2 done for sweeping and brushing over proximal forerarm - dorsal and over bilateral volar forearm and wrist  And palm on R hand prior ionto   skin check done - no issues -pt going to work after session - cannot keep patch on  OT Education - 06/20/17 1044    Education provided  Yes    Education Details  modifications for work - pedicures - splint wearing     Person(s) Educated  Patient    Methods  Explanation;Demonstration    Comprehension  Verbalized understanding;Returned demonstration       OT Short Term Goals - 06/12/17 1443      OT SHORT TERM GOAL #1   Title  Pt pain on PRWHE improve with more than 20 points     Baseline  pain at eval PRWHE 33/50 - no pain in R wrist - L at the worse 4/10     Time  3    Period  Weeks    Status  On-going    Target Date  07/03/17        OT Long Term Goals - 06/12/17 1444      OT LONG TERM GOAL #1   Title  Pt to be independent in HEP for modifications of work act , splint wearing and ROM / modalities     Baseline  splint wearing - but cannot wear at work - try to modify work act , and modalities at home - can only come on husbands off days     Time  4    Period  Weeks    Status  On-going    Target Date  07/10/17      OT LONG TERM GOAL #2   Title  Function on PRWHE for hands improve with more than 15 points     Baseline  at eval 26/50 on PRWHE for function - improving     Time  4    Period  Weeks    Status  On-going    Target Date  07/10/17            Plan - 06/20/17  1045    Clinical Impression Statement  Pt showed great progress in R hand and wrist - pain decrease but staying 4-6/10 at L 1st dorsal compartment - review this date phyically with pt how she do her pedicures - pt still was using  a lot her thumb flexion and extetion with feet and tool use- review modifications with pt - to decrease pain - she had her 8 session of ionto and to wear splint out side of work - only has 2 session left     Occupational performance deficits (Please refer to evaluation for details):  ADL's;IADL's;Work;Play;Leisure;Social Participation    Rehab Potential  Fair    Current Impairments/barriers affecting progress:  Pt working at [employer] and unable to modify her activities and wear splint - she had some symptoms in past in [place]    OT Frequency  1x / week    OT Duration  2 weeks    OT Treatment/Interventions  Self-care/ADL training;Therapeutic exercise;Contrast Bath;Ultrasound;Manual Therapy;Patient/family education;Splinting    Plan  Ionto 9th  session  - assess if pain and numbness improve with modifying her use of  L hand     Clinical Decision Making  Several treatment options, min-mod task modification necessary    OT Home Exercise Plan  see pt instruction    Consulted and Agree with Plan of Care  Patient       Patient will benefit from skilled therapeutic intervention in order to improve the following deficits and impairments:  Pain, Impaired flexibility, Impaired sensation, Impaired UE functional use, Decreased strength  Visit Diagnosis: Pain in joint of left hand  Pain in joint  of right hand  Muscle weakness (generalized)  Other disturbances of skin sensation    Problem List There are no active problems to display for this patient.   Oletta Cohn OTR/L,CLT 06/20/2017, 12:04 PM  [facility] PHYSICAL AND SPORTS MEDICINE [address] [place], [address] Phone: [phone]   Fax:  [phone]  Name:  Jahna Liebert MRN: [id number] Date of Birth: 1960/07/12

## 2017-06-25 ENCOUNTER — Ambulatory Visit: Payer: BLUE CROSS/BLUE SHIELD | Admitting: Occupational Therapy

## 2017-06-25 DIAGNOSIS — M25542 Pain in joints of left hand: Secondary | ICD-10-CM

## 2017-06-25 DIAGNOSIS — M25541 Pain in joints of right hand: Secondary | ICD-10-CM

## 2017-06-25 DIAGNOSIS — M6281 Muscle weakness (generalized): Secondary | ICD-10-CM

## 2017-06-25 DIAGNOSIS — R208 Other disturbances of skin sensation: Secondary | ICD-10-CM

## 2017-06-25 NOTE — Therapy (Signed)
[facility] PHYS[facility]TS [facility]ICINE [address], [place], [address] Phone: [phone]   Fax:  [phone]  Occupational Therapy Treatment  Patient Details  Name: Rhonda James MRN: [id number] Date of Birth: 09-02-1960 Referring Provider: Rosita Kea   Encounter Date: 06/25/2017  OT End of Session - 06/25/17 1608    Visit Number  10    Number of Visits  11    Date for OT Re-Evaluation  07/10/17    OT Start Time  1502    OT Stop Time  1610    OT Time Calculation (min)  68 min    Activity Tolerance  Patient tolerated treatment well    Behavior During Therapy  [facility] for tasks assessed/performed       No past medical history on file.  No past surgical history on file.  There were no vitals filed for this visit.  Subjective Assessment - 06/25/17 1603    Subjective   I do not really have pain using my hand anymore except if I move it the wrong way - I can do my bra now without pain - only wear my splints at night time and I did modify how I use my hands  with the feet massages - it hellps     Patient Stated Goals  I want to get the pain and numbness better so I can do my work  and use them better     Currently in Pain?  Yes    Pain Score  6     Pain Location  Wrist    Pain Orientation  Left    Pain Descriptors / Indicators  Tender    Pain Type  Acute pain    Pain Onset  More than a month ago    Aggravating Factors   Thumb extention or with combine wrist flexion          [facility] OT Assessment - 06/25/17 0001      Strength   Right Hand Grip (lbs)  35    Right Hand Lateral Pinch  15 lbs    Right Hand 3 Point Pinch  12 lbs    Left Hand Grip (lbs)  35    Left Hand Lateral Pinch  12 lbs    Left Hand 3 Point Pinch  11 lbs           Pt cont to have pain and tenderness over L 1st dorsal compartment - 6/10 this date again  Discuss with pt in length modifications to be done at work again  - review with her how she do Pedicures -not to  lift legs but ask them to take it out  And then massage calves with palms - pt was doing feet with thumb flexion and extention   pt to use knuckles and palm - and hold file loose- and not toes with lat grip but between fingers and alternate digits  And wear splint outside of work   Iontophoresis    Type of Iontophoresis  Dexamethasone    Location  L 1 st dorsal compartment     Dose  small patch - 1.8 current     Time  24        RUE Contrast Bath   Time  11 minutes    Comments  piror to soft tissue to CT and forearm        LUE Contrast Bath   Time  11 minutes    Comments  prior to soft tissue , forearm  and CT              Cont to have some tightness and fibrosis atproximal extensor mass at forearmR and L this date - but L worse than R- Graston tool nr 2 done for sweeping and brushing over proximal forerarm - dorsal and over bilateral volar forearm and wrist  And palm on R hand prior ionto   skin check done - no issues -pt going to work after session - cannot keep patch on          OT Education - 06/25/17 1606    Education provided  Yes    Education Details  cont to modify massages and wear splints    Person(s) Educated  Patient    Methods  Explanation;Demonstration    Comprehension  Verbalized understanding;Returned demonstration       OT Short Term Goals - 06/12/17 1443      OT SHORT TERM GOAL #1   Title  Pt pain on PRWHE improve with more than 20 points     Baseline  pain at eval PRWHE 33/50 - no pain in R wrist - L at the worse 4/10     Time  3    Period  Weeks    Status  On-going    Target Date  07/03/17        OT Long Term Goals - 06/12/17 1444      OT LONG TERM GOAL #1   Title  Pt to be independent in HEP for modifications of work act , splint wearing and ROM / modalities     Baseline  splint wearing - but cannot wear at work - try to modify work act , and modalities at home - can only come on husbands off days     Time  4     Period  Weeks    Status  On-going    Target Date  07/10/17      OT LONG TERM GOAL #2   Title  Function on PRWHE for hands improve with more than 15 points     Baseline  at eval 26/50 on PRWHE for function - improving     Time  4    Period  Weeks    Status  On-going    Target Date  07/10/17            Plan - 06/25/17 1623    Clinical Impression Statement   Pt show great progress from [facility] in R hand and wrist - report very little numbness in the am - and pain mostly with palpation on distal radius head - and thumb extention  with resistance -pt did  modify act     Occupational performance deficits (Please refer to evaluation for details):  ADL's;IADL's;Work;Play;Leisure;Social Participation    Rehab Potential  Fair    Current Impairments/barriers affecting progress:  Pt working at [employer] and unable to modify her activities and wear splint - she had some symptoms in past in [place]    OT Frequency  1x / week    OT Duration  2 weeks    OT Treatment/Interventions  Self-care/ADL training;Therapeutic exercise;Contrast Bath;Ultrasound;Manual Therapy;Patient/family education;Splinting    Plan  Ionto 10th  session  - assess if pain and numbness improve with modifying her use of  L hand     Clinical Decision Making  Several treatment options, min-mod task modification necessary    OT Home Exercise Plan  see  pt instruction    Consulted and Agree with Plan of Care  Patient       Patient will benefit from skilled therapeutic intervention in order to improve the following deficits and impairments:  Pain, Impaired flexibility, Impaired sensation, Impaired UE functional use, Decreased strength  Visit Diagnosis: Pain in joint of left hand  Pain in joint of right hand  Muscle weakness (generalized)  Other disturbances of skin sensation    Problem List There are no active problems to display for this patient.   Oletta Cohn OTR/L,CLT 06/25/2017, 5:19 PM  [facility] PHYSICAL AND SPORTS MEDICINE [address] [place], [address] Phone: [phone]   Fax:  [phone]  Name: Rhonda James MRN: [id number] Date of Birth: 1960/05/31

## 2017-06-25 NOTE — Patient Instructions (Signed)
Same

## 2017-06-30 ENCOUNTER — Ambulatory Visit: Payer: BLUE CROSS/BLUE SHIELD | Attending: Orthopedic Surgery | Admitting: Occupational Therapy

## 2017-06-30 DIAGNOSIS — M25542 Pain in joints of left hand: Secondary | ICD-10-CM | POA: Diagnosis present

## 2017-06-30 DIAGNOSIS — M25541 Pain in joints of right hand: Secondary | ICD-10-CM | POA: Insufficient documentation

## 2017-06-30 DIAGNOSIS — R208 Other disturbances of skin sensation: Secondary | ICD-10-CM | POA: Insufficient documentation

## 2017-06-30 DIAGNOSIS — M6281 Muscle weakness (generalized): Secondary | ICD-10-CM | POA: Insufficient documentation

## 2017-06-30 NOTE — Therapy (Signed)
[place] PHYSICAL AND SPORTS MEDICINE [address] [place], [address] Phone: [phone]   Fax:  [phone]  Occupational Therapy Treatment  Patient Details  Name: Rhonda James MRN: [id number] Date of Birth: 1960-04-08 Referring Provider: Rudene Christians   Encounter Date: 06/30/2017  OT End of Session - 06/30/17 2026    Visit Number  11    Number of Visits  11    Date for OT Re-Evaluation  06/30/17    OT Start Time  0918    OT Stop Time  1016    OT Time Calculation (min)  58 min    Activity Tolerance  Patient tolerated treatment well    Behavior During Therapy  [facility] for tasks assessed/performed       No past medical history on file.  No past surgical history on file.  There were no vitals filed for this visit.  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  Yes    Pain Score  6     Pain Location  Wrist    Pain Orientation  Left R digits - 4th the worse     Pain Descriptors / Indicators  Tender;Aching;Tightness;Sore    Pain Type  Chronic pain;Acute pain    Pain Onset  More than a month ago    Pain Frequency  Constant        Pt cont to have pain and tenderness over L 1st dorsal compartment - 6/10 this date again after having a busy weekend at [place] with pt in length modifications to be done at work again  - review with her how she do Pedicures -not to lift legs but ask them to take it out  And then massage calves with palms - pt was doing feet with thumb flexion and extention  pt to use knuckles and palm - and hold file loose- and not toes with lat grip but between fingers and alternate digits  Pt cannot wear splint  at work  Pt also showed this date pain and stiffness over R MC's -and tenderness over 4th digit A1pulley  Report and show triggering - pt report she had arthritis in [place]  And CTS in [place] in past             OT Treatments/Exercises (OP) - 06/30/17 0001      Iontophoresis   Type of Iontophoresis  Dexamethasone    Location  L 1 st dorsal compartment     Dose  small patch - 14    Time  33      RUE Contrast Bath   Time  11 minutes    Comments  prior to soft tissue mobs in palm and 4th digit       LUE Contrast Bath   Time  11 minutes    Comments  soft tissue mobs to forearm and wrist           Cont to have some tightness and fibrosis atproximal extensor mass at forearm L this date -  but L and then - Graston tool nr 2 done for sweeping and brushing over proximal forerarm - dorsal and over bilateral volar forearm and wrist - and on R hand , CT and volar digits and palm   skin check done - no issues -pt going to work after session - cannot keep patch on       OT Education - 06/30/17 2025    Education provided  Yes    Education Details  discuss lack of progress or maintain progress - because of inability to wear splint or change jobs - increasing pain     Person(s) Educated  Patient    Methods  Explanation;Demonstration    Comprehension  Verbalized understanding;Returned demonstration       OT Short Term Goals - 06/30/17 2031      OT SHORT TERM GOAL #1   Title  Pt pain on PRWHE improve with more than 20 points     Baseline  pain at eval PRWHE 33/50 - still increase - now in R 4th digit and stiffness in R MC's     Status  Partially Met        OT Long Term Goals - 06/30/17 2031      OT LONG TERM GOAL #1   Title  Pt to be independent in HEP for modifications of work act , splint wearing and ROM / modalities     Status  Achieved      OT LONG TERM GOAL #2   Title  Function on PRWHE for hands improve with more than 15 points     Baseline  at eval  26/50 on PRWHE for function - improving but still impaired     Status  Partially Met            Plan - 06/30/17 2027    Clinical Impression Statement  Pt showed great progress from [facility] - but show increase pain in R hand diigts - ? has history or arthritis  And CTS in [place]  -and this date trigger finger in 4th -and stiffness in digits - pain over 4th digit A1pulley - and cont to have same tenderness over distal radius head on L and positive [place] - discuss with pt  that because of her inability to wear splint at work and  her act at work - she cannot modify all - decrease progress  and to be pain free or maintain progress  -pain increase with increase act at work - lack of progress or to maintain - pt to see her MD    Occupational performance deficits (Please refer to evaluation for details):  ADL's;IADL's;Work;Play;Leisure;Social Participation    Current Impairments/barriers affecting progress:  Pt working at [employer] and unable to modify her activities and wear splint - she had some symptoms in past in [place]    Plan   Pt to cont with HEP and follow up with her MD or ortho MD     OT Home Exercise Plan  see pt instruction    Consulted and Agree with Plan of Care  Patient       Patient will benefit from skilled therapeutic intervention in order to improve the following deficits and impairments:     Visit Diagnosis: Pain in joint of left hand  Pain in joint of right hand  Muscle weakness (generalized)  Other disturbances of skin sensation    Problem List There are no active problems to display for this patient.   Rosalyn Gess OTR/L,CLT  06/30/2017, 8:32 PM  [place] PH[facility]ICAL AND SPORTS MEDICINE [address], [place], [address] Phone: [phone]   Fax:  [phone]  Name: Rhonda James MRN: [id number] Date of Birth: [birth year]

## 2017-06-30 NOTE — Patient Instructions (Signed)
To cont with contrast , massage splint wearing , ice massage , joint protection and modifications  Discuss with MD

## 2018-05-06 ENCOUNTER — Other Ambulatory Visit: Payer: Self-pay

## 2018-05-06 ENCOUNTER — Emergency Department
Admission: EM | Admit: 2018-05-06 | Discharge: 2018-05-06 | Disposition: A | Discharge status: Home / Self Care | Payer: BLUE CROSS/BLUE SHIELD | Attending: Emergency Medicine | Admitting: Emergency Medicine

## 2018-05-06 ENCOUNTER — Emergency Department: Payer: BLUE CROSS/BLUE SHIELD

## 2018-05-06 DIAGNOSIS — R0789 Other chest pain: Secondary | ICD-10-CM | POA: Diagnosis present

## 2018-05-06 DIAGNOSIS — J209 Acute bronchitis, unspecified: Secondary | ICD-10-CM | POA: Diagnosis not present

## 2018-05-06 LAB — CBC WITH DIFFERENTIAL/PLATELET
Abs Immature Granulocytes: 0.03 10*3/uL (ref 0.00–0.07)
Basophils Absolute: 0.1 10*3/uL (ref 0.0–0.1)
Basophils Relative: 1 %
Eosinophils Absolute: 0.1 10*3/uL (ref 0.0–0.5)
Eosinophils Relative: 2 %
HCT: 42.8 % (ref 36.0–46.0)
Hemoglobin: 13.9 g/dL (ref 12.0–15.0)
Immature Granulocytes: 0 %
Lymphocytes Relative: 33 %
Lymphs Abs: 2.6 10*3/uL (ref 0.7–4.0)
MCH: 29.2 pg (ref 26.0–34.0)
MCHC: 32.5 g/dL (ref 30.0–36.0)
MCV: 89.9 fL (ref 80.0–100.0)
Monocytes Absolute: 0.5 10*3/uL (ref 0.1–1.0)
Monocytes Relative: 6 %
Neutro Abs: 4.6 10*3/uL (ref 1.7–7.7)
Neutrophils Relative %: 58 %
Platelets: 259 10*3/uL (ref 150–400)
RBC: 4.76 MIL/uL (ref 3.87–5.11)
RDW: 13.4 % (ref 11.5–15.5)
WBC: 7.9 10*3/uL (ref 4.0–10.5)
nRBC: 0 % (ref 0.0–0.2)

## 2018-05-06 LAB — HEPATIC FUNCTION PANEL
ALT: 16 U/L (ref 0–44)
AST: 23 U/L (ref 15–41)
Albumin: 4.6 g/dL (ref 3.5–5.0)
Alkaline Phosphatase: 45 U/L (ref 38–126)
Bilirubin, Direct: <0.1 mg/dL (ref 0.0–0.2)
Total Bilirubin: 0.7 mg/dL (ref 0.3–1.2)
Total Protein: 7.9 g/dL (ref 6.5–8.1)

## 2018-05-06 LAB — BASIC METABOLIC PANEL
Anion gap: 9 (ref 5–15)
BUN: 12 mg/dL (ref 6–20)
CO2: 23 mmol/L (ref 22–32)
Calcium: 9.7 mg/dL (ref 8.9–10.3)
Chloride: 107 mmol/L (ref 98–111)
Creatinine, Ser: 0.63 mg/dL (ref 0.44–1.00)
GFR calc Af Amer: >60 mL/min (ref >60)
GFR calc non Af Amer: >60 mL/min (ref >60)
Glucose, Bld: 97 mg/dL (ref 70–99)
Potassium: 3.6 mmol/L (ref 3.5–5.1)
Sodium: 139 mmol/L (ref 135–145)

## 2018-05-06 LAB — TROPONIN I: Troponin I: <0.03 ng/mL (ref <0.03)

## 2018-05-06 LAB — LIPASE, BLOOD: Lipase: 20 U/L (ref 11–51)

## 2018-05-06 MED ORDER — IBUPROFEN 600 MG PO TABS: 600.0000 mg | ORAL_TABLET | Freq: Four times a day (QID) | ORAL | 0 refills | Status: AC | PRN

## 2018-05-06 MED ORDER — ALBUTEROL SULFATE HFA 108 (90 BASE) MCG/ACT IN AERS: 2.0000 | INHALATION_SPRAY | RESPIRATORY_TRACT | 0 refills | Status: AC | PRN

## 2018-05-06 NOTE — ED Triage Notes (Signed)
PT arrives to ed via POV from home. Pt c/o SOC and chest pain starting at 10 am, radiating through to back. Pt reports some cough starting at this am. Pain increases with deep inhalation. Pt a&o x 4 on arrival. NAD noted at this time.

## 2018-05-06 NOTE — ED Provider Notes (Signed)
[facility] Emergency Department Provider Note ____________________________________________   First MD Initiated Contact with Patient 05/06/18 1306     (approximate)  I have reviewed the triage vital signs and the nursing notes.   HISTORY  Chief Complaint Shortness of Breath and Chest Pain  HPI obtained via video interpreter  HPI Rhonda James is a 58 y.o. female with no active medical problems who presents with chest pain, acute onset about 4 hours ago, substernal in location, and worse with deep breath.  She cannot describe the quality of the pain.  She denies shortness of breath, fever, or lightheadedness.  She has had some cough today.  No recent travel outside of the state in the last few weeks or any sick contacts.  The patient traveled to [place] about 2 months ago.  No past medical history on file.  There are no active problems to display for this patient.   No past surgical history on file.  Prior to Admission medications   Medication Sig Start Date End Date Taking? Authorizing Provider  albuterol (PROVENTIL HFA;VENTOLIN HFA) 108 (90 Base) MCG/ACT inhaler Inhale 2 puffs into the lungs every 4 (four) hours as needed for wheezing or shortness of breath. 05/06/18   Dionne BucySiadecki, Josefina Rynders, MD  ibuprofen (ADVIL,MOTRIN) 600 MG tablet Take 1 tablet (600 mg total) by mouth every 6 (six) hours as needed. 05/06/18   Dionne BucySiadecki, Shalia Bartko, MD    Allergies Patient has no allergy information on record.  No family history on file.  Social History Social History   Tobacco Use  . Smoking status: Never Smoker  . Smokeless tobacco: Never Used  Substance Use Topics  . Alcohol use: Never    Frequency: Never  . Drug use: Never    Review of Systems  Constitutional: No fever. Eyes: No redness. ENT: No sore throat. Cardiovascular: Positive for chest pain. Respiratory: Denies shortness of breath. Gastrointestinal: No vomiting or diarrhea.  Genitourinary: Negative  for flank pain.  Musculoskeletal: Negative for back pain. Skin: Negative for rash. Neurological: Negative for headache.   ____________________________________________   PHYSICAL EXAM:  VITAL SIGNS: ED Triage Vitals  Enc Vitals Group     BP 05/06/18 1226 (!) 145/79     Pulse Rate 05/06/18 1226 94     Resp 05/06/18 1232 18     Temp 05/06/18 1226 99.3 F (37.4 C)     Temp Source 05/06/18 1226 Oral     SpO2 05/06/18 1226 100 %     Weight 05/06/18 1227 99 lb 3.3 oz (45 kg)     Height 05/06/18 1227 5' (1.524 m)     Head Circumference --      Peak Flow --      Pain Score 05/06/18 1227 7     Pain Loc --      Pain Edu? --      Excl. in GC? --     Constitutional: Alert and oriented. Well appearing and in no acute distress. Eyes: Conjunctivae are normal.  Head: Atraumatic. Nose: No congestion/rhinnorhea. Mouth/Throat: Mucous membranes are moist.   Neck: Normal range of motion.  Cardiovascular: Normal rate, regular rhythm. Grossly normal heart sounds.  Good peripheral circulation. Respiratory: Normal respiratory effort.  No retractions. Lungs CTAB. Gastrointestinal: No distention.  Musculoskeletal: Extremities warm and well perfused.  Neurologic:  Normal speech and language. No gross focal neurologic deficits are appreciated.  Skin:  Skin is warm and dry. No rash noted. Psychiatric: Mood and affect are normal. Speech and behavior  are normal.  ____________________________________________   LABS (all labs ordered are listed, but only abnormal results are displayed)  Labs Reviewed  BASIC METABOLIC PANEL  CBC WITH DIFFERENTIAL/PLATELET  TROPONIN I  HEPATIC FUNCTION PANEL  LIPASE, BLOOD   ____________________________________________  EKG  ED ECG REPORT I, Dionne Bucy, the attending physician, personally viewed and interpreted this ECG.  Date: 05/06/2018 EKG Time: 1357 Rate: 88 Rhythm: normal sinus rhythm QRS Axis: normal Intervals: normal ST/T Wave  abnormalities: normal Narrative Interpretation: no evidence of acute ischemia  ____________________________________________  RADIOLOGY  CXR: No focal infiltrate or other acute abnormality  ____________________________________________   PROCEDURES  Procedure(s) performed: No  Procedures  Critical Care performed: No ____________________________________________   INITIAL IMPRESSION / ASSESSMENT AND PLAN / ED COURSE  Pertinent labs & imaging results that were available during my care of the patient were reviewed by me and considered in my medical decision making (see chart for details).  58 year old female with no significant PMH presents with atypical and somewhat pleuritic chest pain since this morning, associated with a mild cough.  She has no fevers or other significant associated symptoms.  She has no known exposure to anyone at high risk for COVID-19.  On exam, the patient is well-appearing.  Her vital signs are normal except for a low-grade temperature.  She has no respiratory distress or increased work of breathing.  The remainder of the exam is unremarkable. EKG is nonischemic.  Chest x-ray shows no focal infiltrate.  Overall presentation is most consistent with mild acute bronchitis versus musculoskeletal chest wall pain or GERD.  If this is bronchitis, COVID-19 is on the differential although she does not meet testing criteria at this time.  Although I cannot rule out the patient for PE by the [facility] rule due to her age, she has none of the other risk factors, and no signs or symptoms of PE or DVT.  I have a low suspicion for ACS given the normal EKG and the description of the pain.  There is no clinical evidence for aortic dissection or other vascular etiology.  We will obtain basic labs and a troponin.  If these are within normal limits I anticipate discharge home with symptomatic treatment.  ----------------------------------------- 3:37 PM on 05/06/2018  -----------------------------------------  Lab work-up is unremarkable.  The patient continues to appear well.  There is no indication for a repeat troponin given the duration of the symptoms and the normal EKG.  The patient is stable for discharge home at this time.  I discussed the results of the work-up with her via video interpreter.  I gave her a precautionary home isolation guidelines and the return precautions and she expressed understanding.  I will prescribe albuterol as well as ibuprofen.  _____  Brigitte Pulse was evaluated in Emergency Department on 05/06/2018 for the symptoms described in the history of present illness. She was evaluated in the context of the global COVID-19 pandemic, which necessitated consideration that the patient might be at risk for infection with the SARS-CoV-2 virus that causes COVID-19. Institutional protocols and algorithms that pertain to the evaluation of patients at risk for COVID-19 are in a state of rapid change based on information released by regulatory bodies including the CDC and federal and state organizations. These policies and algorithms were followed during the patient's care in the ED.   ____________________________________________   FINAL CLINICAL IMPRESSION(S) / ED DIAGNOSES  Final diagnoses:  Acute bronchitis, unspecified organism  Atypical chest pain      NEW MEDICATIONS  STARTED DURING THIS VISIT:  New Prescriptions   ALBUTEROL (PROVENTIL HFA;VENTOLIN HFA) 108 (90 BASE) MCG/ACT INHALER    Inhale 2 puffs into the lungs every 4 (four) hours as needed for wheezing or shortness of breath.   IBUPROFEN (ADVIL,MOTRIN) 600 MG TABLET    Take 1 tablet (600 mg total) by mouth every 6 (six) hours as needed.     Note:  This document was prepared using Dragon voice recognition software and may include unintentional dictation errors.    Dionne Bucy, MD 05/06/18 1538

## 2018-05-06 NOTE — ED Notes (Signed)
Pt ambulated to and from restroom with ease. Pt in NAD. Pt requesting [place] interpreter. EDP aware.

## 2018-05-27 ENCOUNTER — Other Ambulatory Visit: Payer: Self-pay | Admitting: Internal Medicine

## 2018-05-27 DIAGNOSIS — Z1231 Encounter for screening mammogram for malignant neoplasm of breast: Secondary | ICD-10-CM

## 2018-09-21 ENCOUNTER — Other Ambulatory Visit: Payer: Self-pay | Admitting: Physician Assistant

## 2018-09-21 DIAGNOSIS — Z1231 Encounter for screening mammogram for malignant neoplasm of breast: Secondary | ICD-10-CM

## 2019-02-15 ENCOUNTER — Other Ambulatory Visit: Payer: Self-pay | Admitting: Family Medicine

## 2019-02-15 DIAGNOSIS — R1013 Epigastric pain: Secondary | ICD-10-CM

## 2019-02-25 ENCOUNTER — Other Ambulatory Visit: Payer: Self-pay

## 2019-02-25 ENCOUNTER — Ambulatory Visit
Admission: RE | Admit: 2019-02-25 | Discharge: 2019-02-25 | Disposition: A | Discharge status: Home / Self Care | Payer: BC Managed Care – PPO | Source: Ambulatory Visit | Attending: Family Medicine | Admitting: Family Medicine

## 2019-02-25 DIAGNOSIS — R1013 Epigastric pain: Secondary | ICD-10-CM | POA: Diagnosis not present

## 2019-03-23 ENCOUNTER — Other Ambulatory Visit: Payer: Self-pay | Admitting: Physician Assistant

## 2019-03-23 DIAGNOSIS — Z780 Asymptomatic menopausal state: Secondary | ICD-10-CM

## 2019-04-09 ENCOUNTER — Ambulatory Visit: Payer: BC Managed Care – PPO

## 2019-05-13 ENCOUNTER — Other Ambulatory Visit: Payer: Self-pay | Admitting: Physician Assistant

## 2019-05-13 DIAGNOSIS — Z1231 Encounter for screening mammogram for malignant neoplasm of breast: Secondary | ICD-10-CM

## 2019-05-13 DIAGNOSIS — Z780 Asymptomatic menopausal state: Secondary | ICD-10-CM

## 2020-08-08 ENCOUNTER — Other Ambulatory Visit: Payer: Self-pay | Admitting: Internal Medicine

## 2020-08-08 DIAGNOSIS — Z1231 Encounter for screening mammogram for malignant neoplasm of breast: Secondary | ICD-10-CM

## 2020-09-12 LAB — COLOGUARD: COLOGUARD: NEGATIVE

## 2020-12-01 ENCOUNTER — Ambulatory Visit
Admission: RE | Admit: 2020-12-01 | Discharge: 2020-12-01 | Disposition: A | Discharge status: Home / Self Care | Payer: BC Managed Care – PPO | Source: Ambulatory Visit | Attending: Internal Medicine | Admitting: Internal Medicine

## 2020-12-01 ENCOUNTER — Other Ambulatory Visit: Payer: Self-pay

## 2020-12-01 DIAGNOSIS — Z1231 Encounter for screening mammogram for malignant neoplasm of breast: Secondary | ICD-10-CM | POA: Diagnosis not present

## 2020-12-19 ENCOUNTER — Other Ambulatory Visit: Payer: Self-pay

## 2020-12-19 ENCOUNTER — Other Ambulatory Visit: Payer: Self-pay | Admitting: Internal Medicine

## 2020-12-19 ENCOUNTER — Ambulatory Visit
Admission: RE | Admit: 2020-12-19 | Discharge: 2020-12-19 | Disposition: A | Discharge status: Home / Self Care | Payer: BC Managed Care – PPO | Source: Ambulatory Visit | Attending: Internal Medicine | Admitting: Internal Medicine

## 2020-12-19 DIAGNOSIS — R053 Chronic cough: Secondary | ICD-10-CM

## 2020-12-19 DIAGNOSIS — R042 Hemoptysis: Secondary | ICD-10-CM

## 2020-12-19 MED ORDER — IOHEXOL 300 MG/ML  SOLN
75.0000 mL | Freq: Once | INTRAMUSCULAR | Status: AC | PRN
  Administered 2020-12-19: 75 mL via INTRAVENOUS

## 2021-03-15 ENCOUNTER — Other Ambulatory Visit: Payer: Self-pay | Admitting: Internal Medicine

## 2021-03-15 DIAGNOSIS — J849 Interstitial pulmonary disease, unspecified: Secondary | ICD-10-CM

## 2021-04-11 ENCOUNTER — Other Ambulatory Visit: Payer: BC Managed Care – PPO

## 2022-07-29 ENCOUNTER — Other Ambulatory Visit: Payer: Self-pay | Admitting: Internal Medicine

## 2022-07-29 DIAGNOSIS — M542 Cervicalgia: Secondary | ICD-10-CM

## 2022-07-31 ENCOUNTER — Ambulatory Visit
Admission: RE | Admit: 2022-07-31 | Discharge: 2022-07-31 | Disposition: A | Discharge status: Home / Self Care | Payer: BC Managed Care – PPO | Source: Ambulatory Visit | Attending: Internal Medicine | Admitting: Internal Medicine

## 2022-07-31 DIAGNOSIS — M542 Cervicalgia: Secondary | ICD-10-CM | POA: Insufficient documentation

## 2022-07-31 MED ORDER — IOHEXOL 300 MG/ML  SOLN
75.0000 mL | Freq: Once | INTRAMUSCULAR | Status: AC | PRN
  Administered 2022-07-31: 75 mL via INTRAVENOUS

## 2023-03-08 IMAGING — MG MM DIGITAL SCREENING BILAT W/ TOMO AND CAD
6 of 10 series · 6 of 30 positions shown · non-contrast
Comparison: Previous exam(s).

CLINICAL DATA: Screening.

EXAM:
DIGITAL SCREENING BILATERAL MAMMOGRAM WITH TOMOSYNTHESIS AND CAD
TECHNIQUE: Bilateral screening digital craniocaudal and mediolateral oblique
mammograms were obtained. Bilateral screening digital breast
tomosynthesis was performed. The images were evaluated with
computer-aided detection.

[L MLO synth-2D (1 of 2)]
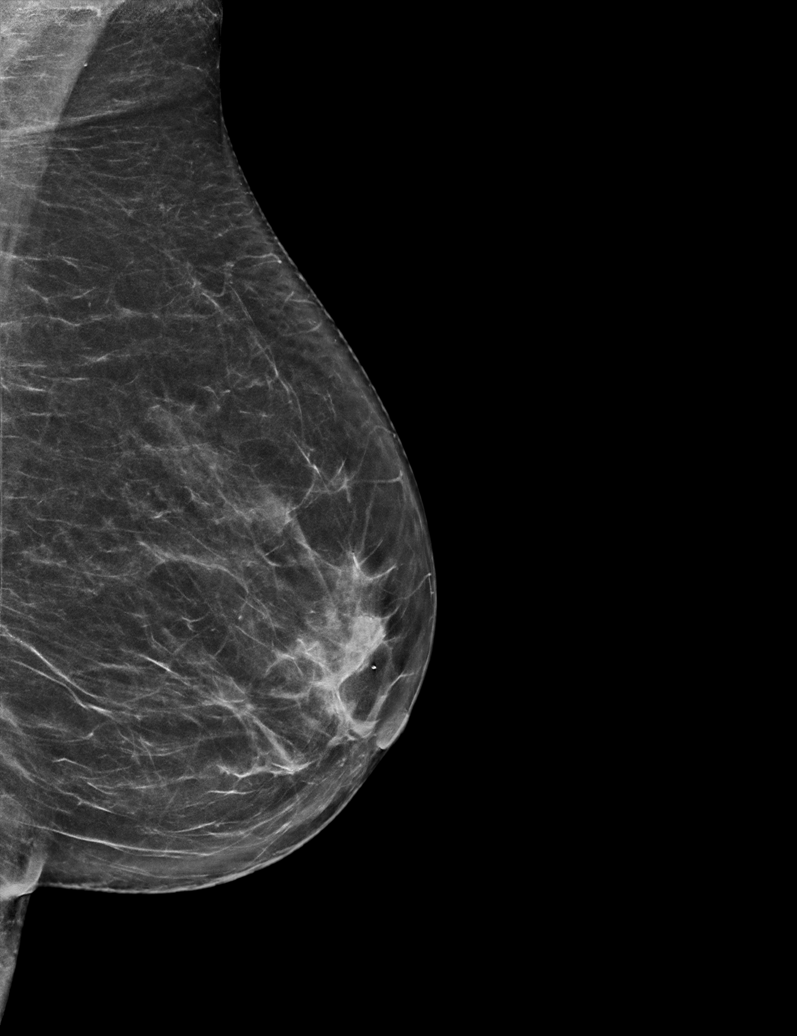

[L MLO synth-2D (2 of 2)]
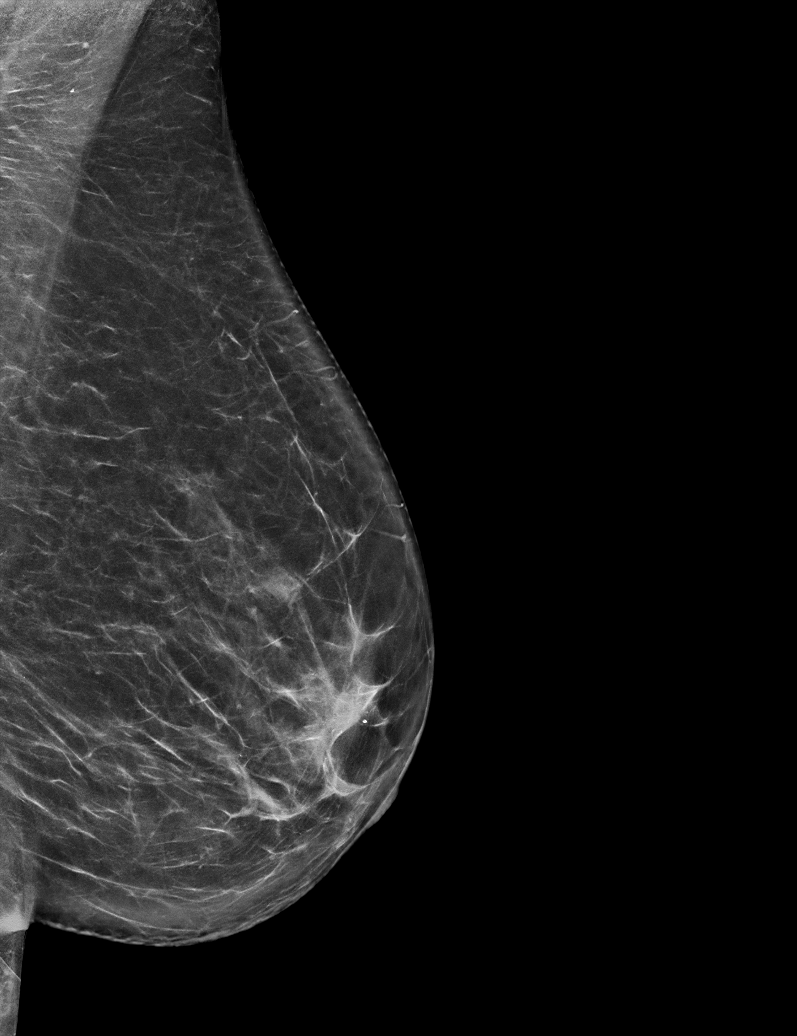

[L CC synth-2D]
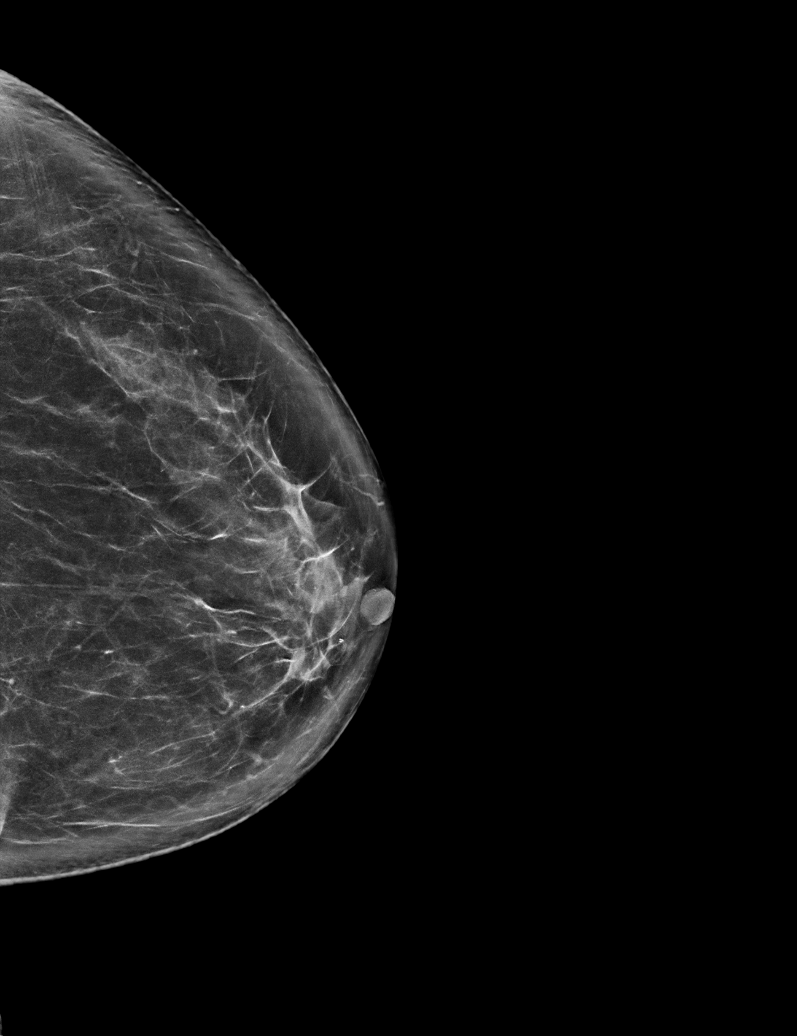

[R CC synth-2D]
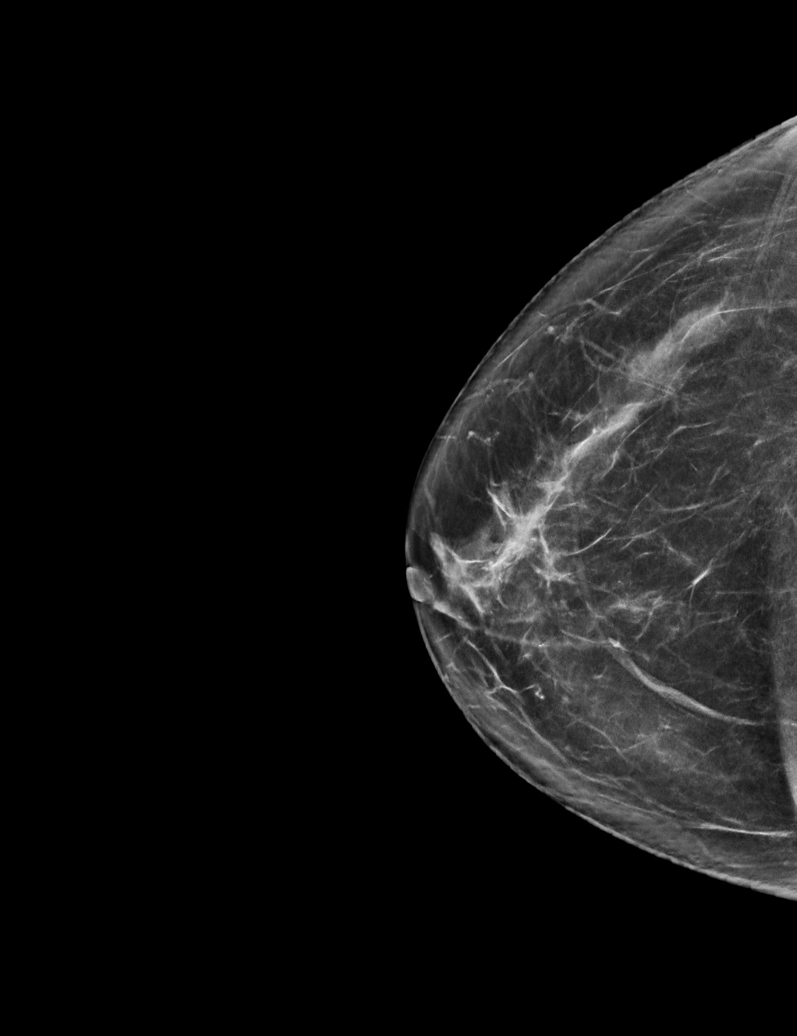

[R MLO synth-2D]
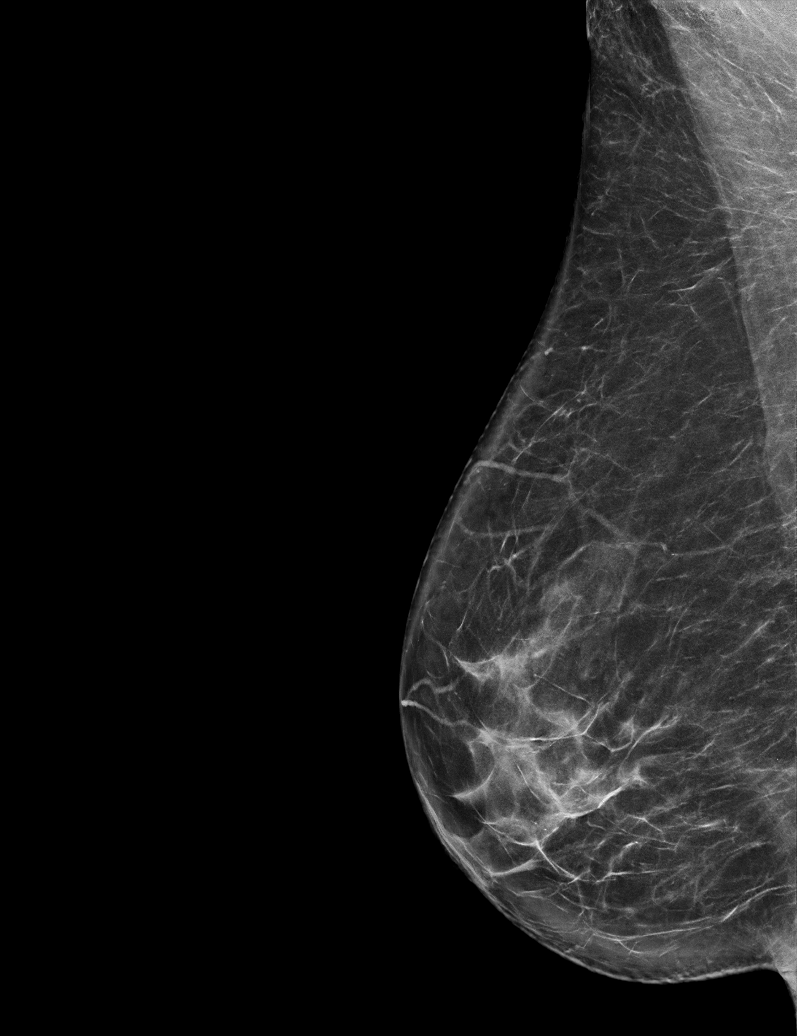

[R MLO tomo · tomo slice 39/77.0]
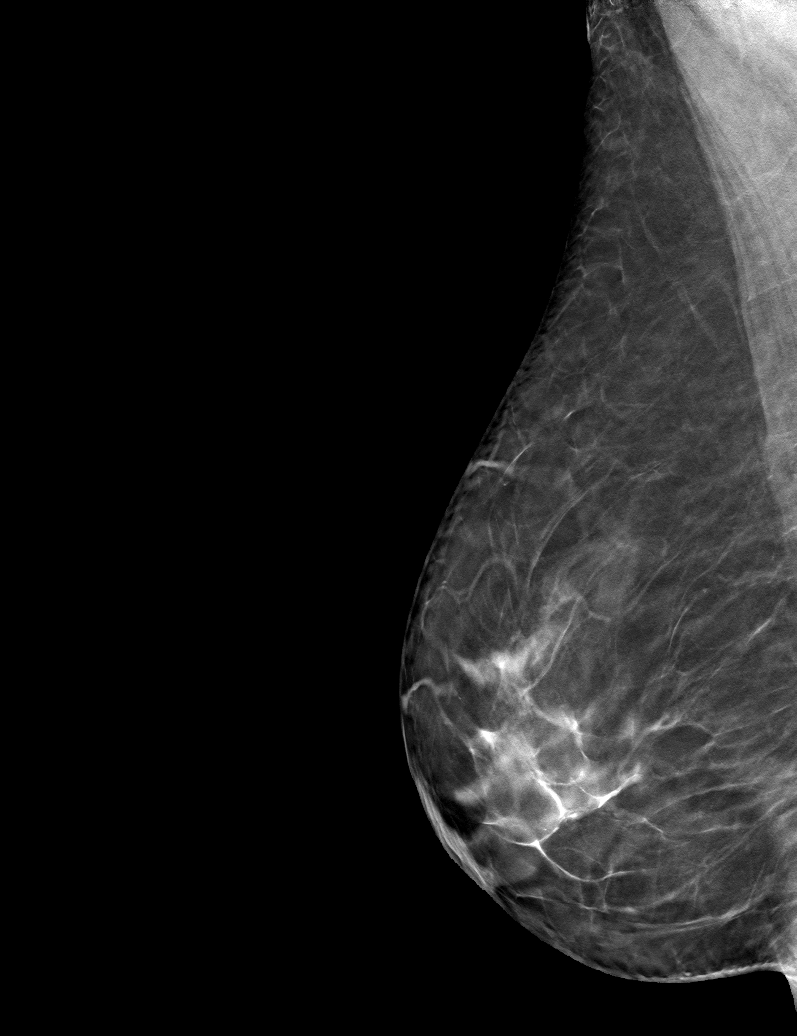

[6 of 30 positions shown; findings below may reference images not displayed]

ACR Breast Density Category b: There are scattered areas of
fibroglandular density.
FINDINGS: There are no findings suspicious for malignancy.
IMPRESSION: No mammographic evidence of malignancy. A result letter of this
screening mammogram will be mailed directly to the patient.

RECOMMENDATION:
Screening mammogram in one year. (Code:51-O-LD2)

BI-RADS CATEGORY  1: Negative.

## 2023-03-10 DIAGNOSIS — M65322 Trigger finger, left index finger: Secondary | ICD-10-CM | POA: Diagnosis not present

## 2023-03-10 DIAGNOSIS — M65331 Trigger finger, right middle finger: Secondary | ICD-10-CM | POA: Diagnosis not present

## 2023-04-02 DIAGNOSIS — Z0000 Encounter for general adult medical examination without abnormal findings: Secondary | ICD-10-CM | POA: Diagnosis not present

## 2023-04-02 DIAGNOSIS — Z780 Asymptomatic menopausal state: Secondary | ICD-10-CM | POA: Diagnosis not present

## 2023-05-12 DIAGNOSIS — M62838 Other muscle spasm: Secondary | ICD-10-CM | POA: Diagnosis not present

## 2023-05-12 DIAGNOSIS — G44209 Tension-type headache, unspecified, not intractable: Secondary | ICD-10-CM | POA: Diagnosis not present

## 2023-05-12 DIAGNOSIS — M5432 Sciatica, left side: Secondary | ICD-10-CM | POA: Diagnosis not present

## 2023-05-12 DIAGNOSIS — M533 Sacrococcygeal disorders, not elsewhere classified: Secondary | ICD-10-CM | POA: Diagnosis not present

## 2023-07-04 ENCOUNTER — Emergency Department
Admission: EM | Admit: 2023-07-04 | Discharge: 2023-07-04 | Disposition: A | Discharge status: Home / Self Care | Attending: Emergency Medicine | Admitting: Emergency Medicine

## 2023-07-04 ENCOUNTER — Other Ambulatory Visit: Payer: Self-pay

## 2023-07-04 ENCOUNTER — Emergency Department

## 2023-07-04 DIAGNOSIS — R0789 Other chest pain: Secondary | ICD-10-CM | POA: Diagnosis not present

## 2023-07-04 DIAGNOSIS — R079 Chest pain, unspecified: Secondary | ICD-10-CM | POA: Diagnosis not present

## 2023-07-04 LAB — BASIC METABOLIC PANEL WITH GFR
Anion gap: 12 (ref 5–15)
BUN: 15 mg/dL (ref 8–23)
CO2: 23 mmol/L (ref 22–32)
Calcium: 9.5 mg/dL (ref 8.9–10.3)
Chloride: 103 mmol/L (ref 98–111)
Creatinine, Ser: 0.57 mg/dL (ref 0.44–1.00)
GFR, Estimated: >60 mL/min (ref >60)
Glucose, Bld: 95 mg/dL (ref 70–99)
Potassium: 3.9 mmol/L (ref 3.5–5.1)
Sodium: 138 mmol/L (ref 135–145)

## 2023-07-04 LAB — CBC
HCT: 38.2 % (ref 36.0–46.0)
Hemoglobin: 12.9 g/dL (ref 12.0–15.0)
MCH: 29.5 pg (ref 26.0–34.0)
MCHC: 33.8 g/dL (ref 30.0–36.0)
MCV: 87.4 fL (ref 80.0–100.0)
Platelets: 275 10*3/uL (ref 150–400)
RBC: 4.37 MIL/uL (ref 3.87–5.11)
RDW: 12.8 % (ref 11.5–15.5)
WBC: 6.8 10*3/uL (ref 4.0–10.5)
nRBC: 0 % (ref 0.0–0.2)

## 2023-07-04 LAB — TROPONIN I (HIGH SENSITIVITY): Troponin I (High Sensitivity): 3 ng/L (ref <18)

## 2023-07-04 MED ORDER — PANTOPRAZOLE SODIUM 40 MG PO TBEC: 40.0000 mg | DELAYED_RELEASE_TABLET | Freq: Every day | ORAL | 1 refills | Status: AC

## 2023-07-04 MED ORDER — FAMOTIDINE 20 MG PO TABS
20.0000 mg | ORAL_TABLET | Freq: Once | ORAL | Status: AC
  Administered 2023-07-04: 20 mg via ORAL
  Filled 2023-07-04: qty 1

## 2023-07-04 NOTE — ED Provider Notes (Signed)
 [facility] Provider Note    Event Date/Time   First MD Initiated Contact with Patient 07/04/23 1953     (approximate)   History   Chest Pain   HPI  HPI obtained via video interpreter  Rhonda James is a 63 y.o. female with a history of GERD and osteoporosis who presents with an episode of chest pain, acute onset today around noon, lasting 10 minutes, and subsequently completely resolved.  The patient states that she feels like the pain radiates up to her throat when it happens.  She has had episodes like this for some time.  She states that she was on a medication for acid reflux but ran out of it and is waiting for the refill.  She denies any abdominal pain.  She had some shortness of breath during the episode, but it has now resolved.  She denies feeling dizzy or lightheaded.  She has no difficulty swallowing.  Patient also reports bilateral calf pain which has been present for several weeks and is not associated with any swelling.  She denies any weakness or numbness.  She thinks it is related to her sciatica.  I have the past medical records.  The patient's most recent outpatient encounter prior to today was on 4/14 with her primary care provider.  She reported bilateral hip pain and headaches at that time.  She was on Protonix for GERD.   Physical Exam   Triage Vital Signs: ED Triage Vitals  Encounter Vitals Group     BP 07/04/23 1834 (!) 144/73     Systolic BP Percentile --      Diastolic BP Percentile --      Pulse Rate 07/04/23 1834 81     Resp 07/04/23 1834 16     Temp 07/04/23 1834 98 F (36.7 C)     Temp Source 07/04/23 1834 Oral     SpO2 07/04/23 1834 100 %     Weight --      Height --      Head Circumference --      Peak Flow --      Pain Score 07/04/23 1833 5     Pain Loc --      Pain Education --      Exclude from Growth Chart --     Most recent vital signs: Vitals:   07/04/23 1834  BP: (!) 144/73  Pulse: 81  Resp: 16  Temp: 98  F (36.7 C)  SpO2: 100%     General: Alert, well-appearing, no distress.  CV:  Good peripheral perfusion.  Normal heart sounds. Resp:  Normal effort. Lungs CTAB. Abd:  No distention.  Other:  No calf or popliteal swelling or tenderness.  2+ DP pulses bilaterally.  Normal coloration to the lower legs.   ED Results / Procedures / Treatments   Labs (all labs ordered are listed, but only abnormal results are displayed) Labs Reviewed  BASIC METABOLIC PANEL WITH GFR  CBC  TROPONIN I (HIGH SENSITIVITY)     EKG  ED ECG REPORT I, Lind Repine, the attending physician, personally viewed and interpreted this ECG.  Date: 07/04/2023 EKG Time: 1846 Rate: 80 Rhythm: normal sinus rhythm QRS Axis: normal Intervals: normal ST/T Wave abnormalities: normal Narrative Interpretation: no evidence of acute ischemia    RADIOLOGY  Chest x-ray: I independently viewed and interpreted the images; there is no focal consolidation or edema  PROCEDURES:  Critical Care performed: No  Procedures   MEDICATIONS ORDERED  IN ED: Medications  famotidine (PEPCID) tablet 20 mg (20 mg Oral Given 07/04/23 2045)     IMPRESSION / MDM / ASSESSMENT AND PLAN / ED COURSE  I reviewed the triage vital signs and the nursing notes.  63 year old female with PMH as noted above presents with a brief episode of chest pain earlier today which is now resolved.  She has had multiple similar episodes in the past.  She has recently been off of her pantoprazole after running out of it.  On exam the patient is well-appearing.  Her vital signs are normal.  The physical exam is unremarkable for acute findings.  EKG is nonischemic.  Chest x-ray is clear.  The patient is currently asymptomatic.  Differential diagnosis includes, but is not limited to, GERD, musculoskeletal chest pain, other benign etiology; I have a low suspicion for ACS.  There is no clinical evidence for PE given that the pain is completely resolved on  its own.  Similarly there is no clinical evidence for aortic dissection or other vascular etiology.  There is also no evidence of an acute process causing the calf pain.  There is no clinical evidence for DVT.   Patient's presentation is most consistent with acute complicated illness / injury requiring diagnostic workup.  BMP, CBC, and troponin are negative.  Given that the troponin was obtained more than 6 hours after the pain episode, there is no indication for a repeat.  At this time, the patient is stable for discharge home.  She feels well and would like to go home.  I have given a dose of Pepcid here and have represcribed her pantoprazole.  I gave strict return precautions and answered all of her questions via video interpreter; she expressed understanding and agreement.   FINAL CLINICAL IMPRESSION(S) / ED DIAGNOSES   Final diagnoses:  Atypical chest pain     Rx / DC Orders   ED Discharge Orders          Ordered    pantoprazole (PROTONIX) 40 MG tablet  Daily        07/04/23 2043             Note:  This document was prepared using Dragon voice recognition software and may include unintentional dictation errors.    Lind Repine, MD 07/04/23 2250

## 2023-07-04 NOTE — ED Triage Notes (Signed)
 First nurse note: Pt to ED from [facility]. Pt reports heaviness in chest with SOB. Pt has hx of reflux.

## 2023-07-04 NOTE — ED Triage Notes (Signed)
 C/o CP that started after eating food. Pt reports to central chest for pain and reports she has been burping as well. Pt reports she has had similar episodes like this before that has self relieved. Pt is dx with GERD but does not ran out of meds, unsure of name of med. Denies SOB and dizziness. GCS 15, Ambulatory in triage.

## 2023-08-21 ENCOUNTER — Other Ambulatory Visit: Payer: Self-pay | Admitting: Internal Medicine

## 2023-08-21 DIAGNOSIS — Z1231 Encounter for screening mammogram for malignant neoplasm of breast: Secondary | ICD-10-CM

## 2023-12-02 DIAGNOSIS — M810 Age-related osteoporosis without current pathological fracture: Secondary | ICD-10-CM | POA: Diagnosis not present

## 2023-12-03 DIAGNOSIS — Z23 Encounter for immunization: Secondary | ICD-10-CM | POA: Diagnosis not present
